# Patient Record
Sex: Male | Born: 1965
Health system: Southern US, Community
[De-identification: ages and names within clinical notes are randomized; demographics above are authoritative.]

## PROBLEM LIST (undated history)

## (undated) DIAGNOSIS — I10 Essential (primary) hypertension: Secondary | ICD-10-CM

## (undated) DIAGNOSIS — K5090 Crohn's disease, unspecified, without complications: Secondary | ICD-10-CM

## (undated) HISTORY — DX: Essential (primary) hypertension: I10

## (undated) HISTORY — PX: TONSILLECTOMY: SUR1361

## (undated) HISTORY — PX: SHOULDER SURGERY: SHX246

---

## 2001-04-08 ENCOUNTER — Emergency Department (HOSPITAL_COMMUNITY): Admission: EM | Admit: 2001-04-08 | Discharge: 2001-04-08 | Payer: Self-pay | Admitting: Emergency Medicine

## 2001-04-11 ENCOUNTER — Encounter (HOSPITAL_COMMUNITY): Admission: RE | Admit: 2001-04-11 | Discharge: 2001-07-10 | Payer: Self-pay | Admitting: Emergency Medicine

## 2013-11-14 ENCOUNTER — Encounter (HOSPITAL_COMMUNITY): Payer: Self-pay | Admitting: Emergency Medicine

## 2013-11-14 ENCOUNTER — Emergency Department (HOSPITAL_COMMUNITY)
Admission: EM | Admit: 2013-11-14 | Discharge: 2013-11-14 | Disposition: A | Discharge status: Home / Self Care | Payer: BC Managed Care – PPO | Attending: Emergency Medicine | Admitting: Emergency Medicine

## 2013-11-14 ENCOUNTER — Emergency Department (HOSPITAL_COMMUNITY): Payer: BC Managed Care – PPO

## 2013-11-14 DIAGNOSIS — R109 Unspecified abdominal pain: Secondary | ICD-10-CM

## 2013-11-14 DIAGNOSIS — R1031 Right lower quadrant pain: Secondary | ICD-10-CM | POA: Insufficient documentation

## 2013-11-14 LAB — CBC WITH DIFFERENTIAL/PLATELET
Basophils Absolute: 0 10*3/uL (ref 0.0–0.1)
Basophils Relative: 0 % (ref 0–1)
Eosinophils Absolute: 0.1 10*3/uL (ref 0.0–0.7)
Eosinophils Relative: 1 % (ref 0–5)
HCT: 41.3 % (ref 39.0–52.0)
Hemoglobin: 15 g/dL (ref 13.0–17.0)
Lymphocytes Relative: 7 % — ABNORMAL LOW (ref 12–46)
Lymphs Abs: 1.2 10*3/uL (ref 0.7–4.0)
MCH: 31 pg (ref 26.0–34.0)
MCHC: 36.3 g/dL — ABNORMAL HIGH (ref 30.0–36.0)
MCV: 85.3 fL (ref 78.0–100.0)
Monocytes Absolute: 1 10*3/uL (ref 0.1–1.0)
Monocytes Relative: 6 % (ref 3–12)
Neutro Abs: 14.7 10*3/uL — ABNORMAL HIGH (ref 1.7–7.7)
Neutrophils Relative %: 86 % — ABNORMAL HIGH (ref 43–77)
Platelets: 199 10*3/uL (ref 150–400)
RBC: 4.84 MIL/uL (ref 4.22–5.81)
RDW: 12.8 % (ref 11.5–15.5)
WBC: 17.1 10*3/uL — ABNORMAL HIGH (ref 4.0–10.5)

## 2013-11-14 LAB — COMPREHENSIVE METABOLIC PANEL
ALT: 19 U/L (ref 0–53)
AST: 18 U/L (ref 0–37)
Albumin: 3.7 g/dL (ref 3.5–5.2)
Alkaline Phosphatase: 74 U/L (ref 39–117)
BUN: 11 mg/dL (ref 6–23)
CO2: 25 mEq/L (ref 19–32)
Calcium: 9 mg/dL (ref 8.4–10.5)
Chloride: 103 mEq/L (ref 96–112)
Creatinine, Ser: 0.99 mg/dL (ref 0.50–1.35)
GFR calc Af Amer: >90 mL/min (ref >90)
GFR calc non Af Amer: >90 mL/min (ref >90)
Glucose, Bld: 98 mg/dL (ref 70–99)
Potassium: 4.1 mEq/L (ref 3.7–5.3)
Sodium: 138 mEq/L (ref 137–147)
Total Bilirubin: 1 mg/dL (ref 0.3–1.2)
Total Protein: 6.7 g/dL (ref 6.0–8.3)

## 2013-11-14 LAB — URINALYSIS, ROUTINE W REFLEX MICROSCOPIC
Bilirubin Urine: NEGATIVE
Glucose, UA: NEGATIVE mg/dL
Hgb urine dipstick: NEGATIVE
Ketones, ur: NEGATIVE mg/dL
Leukocytes, UA: NEGATIVE
Nitrite: NEGATIVE
Protein, ur: NEGATIVE mg/dL
Specific Gravity, Urine: 1.024 (ref 1.005–1.030)
Urobilinogen, UA: 1 mg/dL (ref 0.0–1.0)
pH: 5.5 (ref 5.0–8.0)

## 2013-11-14 MED ORDER — ONDANSETRON HCL 4 MG/2ML IJ SOLN
4.0000 mg | Freq: Once | INTRAMUSCULAR | Status: AC
  Administered 2013-11-14: 4 mg via INTRAVENOUS
  Filled 2013-11-14: qty 2

## 2013-11-14 MED ORDER — CIPROFLOXACIN HCL 500 MG PO TABS
500.0000 mg | ORAL_TABLET | Freq: Once | ORAL | Status: AC
  Administered 2013-11-14: 500 mg via ORAL
  Filled 2013-11-14: qty 1

## 2013-11-14 MED ORDER — SODIUM CHLORIDE 0.9 % IV BOLUS (SEPSIS)
1000.0000 mL | Freq: Once | INTRAVENOUS | Status: AC
  Administered 2013-11-14: 1000 mL via INTRAVENOUS

## 2013-11-14 MED ORDER — METRONIDAZOLE 500 MG PO TABS
500.0000 mg | ORAL_TABLET | Freq: Once | ORAL | Status: AC
  Administered 2013-11-14: 500 mg via ORAL
  Filled 2013-11-14: qty 1

## 2013-11-14 MED ORDER — OXYCODONE-ACETAMINOPHEN 5-325 MG PO TABS: 1.0000 | ORAL_TABLET | Freq: Four times a day (QID) | ORAL | Status: DC | PRN

## 2013-11-14 MED ORDER — ONDANSETRON HCL 4 MG PO TABS: 4.0000 mg | ORAL_TABLET | Freq: Four times a day (QID) | ORAL | Status: DC

## 2013-11-14 MED ORDER — CIPROFLOXACIN HCL 500 MG PO TABS: 500.0000 mg | ORAL_TABLET | Freq: Two times a day (BID) | ORAL | Status: DC

## 2013-11-14 MED ORDER — HYDROMORPHONE HCL PF 1 MG/ML IJ SOLN
1.0000 mg | Freq: Once | INTRAMUSCULAR | Status: AC
  Administered 2013-11-14: 1 mg via INTRAVENOUS
  Filled 2013-11-14: qty 1

## 2013-11-14 MED ORDER — IOHEXOL 300 MG/ML  SOLN
100.0000 mL | Freq: Once | INTRAMUSCULAR | Status: AC | PRN
  Administered 2013-11-14: 100 mL via INTRAVENOUS

## 2013-11-14 MED ORDER — IOHEXOL 300 MG/ML  SOLN
50.0000 mL | Freq: Once | INTRAMUSCULAR | Status: AC | PRN
  Administered 2013-11-14: 50 mL via ORAL

## 2013-11-14 MED ORDER — METRONIDAZOLE 500 MG PO TABS: 500.0000 mg | ORAL_TABLET | Freq: Two times a day (BID) | ORAL | Status: DC

## 2013-11-14 NOTE — ED Notes (Signed)
Pt from home. Complains of right lower abd pain since noon today. Pain progressively worsening. Denies N/V.

## 2013-11-14 NOTE — ED Provider Notes (Signed)
Medical screening examination/treatment/procedure(s) were performed by non-physician practitioner and as supervising physician I was immediately available for consultation/collaboration.   Celene KrasJon R Kenny Stern, MD 11/14/13 2032

## 2013-11-14 NOTE — ED Provider Notes (Signed)
CSN: [id number]     Arrival date & time 11/14/13    History   First MD Initiated Contact with Patient 11/14/13 1721     Chief Complaint  Patient presents with  . Abdominal Pain     (Consider location/radiation/quality/duration/timing/severity/associated sxs/prior Treatment) HPI Patient to the ER with complaints of abdominal pains that started this afternoon. He said he starts around his umbilicus and then as the day progressed moved towards his RLQ. Is has also been getting more severe in quality. He has no hx of abdominal surgery. He does have GI problems at baseline but denies any changes in his bowel moments. Last bowel movement was this morning and it was normal. He tried taking Benefiber in case his pain was gas or constipation but this did not help. He last ate at noon today. He has not had nausea or vomiting. He called the on-call physician at [facility] who advised he come to the hospital for a abdominal scan. Pain currently 6/10.  History reviewed. No pertinent past medical history. History reviewed. No pertinent past surgical history. History reviewed. No pertinent family history. History  Substance Use Topics  . Smoking status: Never Smoker   . Smokeless tobacco: Not on file  . Alcohol Use: No    Review of Systems  The patient denies anorexia, fever, weight loss,, vision loss, decreased hearing, hoarseness, chest pain, syncope, dyspnea on exertion, peripheral edema, balance deficits, hemoptysis, melena, hematochezia, severe indigestion/heartburn, hematuria, incontinence, genital sores, muscle weakness, suspicious skin lesions, transient blindness, difficulty walking, depression, unusual weight change, abnormal bleeding, enlarged lymph nodes, angioedema, and breast masses.   Allergies  Review of patient's allergies indicates no known allergies.  Home Medications   Current Outpatient Rx  Name  Route  Sig  Dispense  Refill  . albuterol (PROVENTIL HFA;VENTOLIN  HFA) 108 (90 BASE) MCG/ACT inhaler   Inhalation   Inhale 2 puffs into the lungs every 6 (six) hours as needed for wheezing or shortness of breath.         . escitalopram (LEXAPRO) 20 MG tablet   Oral   Take 20 mg by mouth every morning.          BP 149/92  Pulse 70  Temp(Src) 98.5 F (36.9 C) (Oral)  Ht 5\' 9"  (1.753 m)  Wt 170 lb (77.111 kg)  BMI 25.09 kg/m2  SpO2 99% Physical Exam  Nursing note and vitals reviewed. Constitutional: He appears well-developed and well-nourished. No distress.  HENT:  Head: Normocephalic and atraumatic.  Eyes: Pupils are equal, round, and reactive to light.  Neck: Normal range of motion. Neck supple.  Cardiovascular: Normal rate and regular rhythm.   Pulmonary/Chest: Effort normal.  Abdominal: Soft. He exhibits no distension. Bowel sounds are increased. There is tenderness in the right lower quadrant. There is guarding. There is no rigidity and no CVA tenderness.  Neurological: He is alert.  Skin: Skin is warm and dry.    ED Course  Procedures (including critical care time) Labs Review Labs Reviewed  CBC WITH DIFFERENTIAL - Abnormal; Notable for the following:    WBC 17.1 (*)    MCHC 36.3 (*)    Neutrophils Relative % 86 (*)    Neutro Abs 14.7 (*)    Lymphocytes Relative 7 (*)    All other components within normal limits  COMPREHENSIVE METABOLIC PANEL  URINALYSIS, ROUTINE W REFLEX MICROSCOPIC   Imaging Review Ct Abdomen Pelvis W Contrast  11/14/2013   CLINICAL DATA:  Right  upper quadrant pain.  Nausea vomiting.  EXAM: CT ABDOMEN AND PELVIS WITH CONTRAST  TECHNIQUE: Multidetector CT imaging of the abdomen and pelvis was performed using the standard protocol following bolus administration of intravenous contrast.  CONTRAST:  50mL OMNIPAQUE IOHEXOL 300 MG/ML SOLN, 100mL OMNIPAQUE IOHEXOL 300 MG/ML SOLN  COMPARISON:  None.  FINDINGS: There is wall thickening of the distal ileum, extending for approximately 20 cm from the ileocolic junction.  Wall measures 6 7 mm in maximum thickness. There is associated mesenteric stranding and a small amount of free fluid. Small bowel above this is mildly distended. There is small bowel air-fluid levels.  No colonic wall thickening is seen. There are several left colon diverticula. No diverticulitis. Normal appendix is visualized. Stomach is unremarkable.  Lung bases are essentially clear. The heart is normal in size. Normal liver, spleen, gallbladder and pancreas. No bile duct dilation. No adrenal masses. Normal kidneys, ureters and bladder. There are bilateral fat containing inguinal hernias. Sigmoid colon extends into the internal inguinal ring on the left.  There are prominent mesenteric lymph nodes most evident along the ileo palate chain. The largest measures 1 cm in short axis.  No significant bony abnormality.  IMPRESSION: 1. Wall thickening and adjacent inflammation of the distal ileum for an approximate 20 cm length of small bowel. This may reflect an inflammatory or infectious enteritis. There are associated prominent to borderline enlarged palate mesenteric lymph nodes. Trace ascites. Crohn's disease should be considered. 2. No other acute findings.  Normal appendix is visualized.   Electronically Signed   By: Amie Portlandavid  Ormond M.D.   On: 11/14/2013 19:10     EKG Interpretation None      MDM   Final diagnoses:  Abdominal pain    CT scan says Crohn's vs inflammatory vs infectious enteritis. I discussed with Dr. Lynelle DoctorKnapp, J. Who agreed that I called the GI doctor to discuss.   Dr. Ewing SchleinMagod recommends treating patient with antibiotics, Cipro and Flagyl as well as give pain medication for home. Pt needs to be on liquid diet. If patient continues to be in severe pain, can admit if you need otherwise he should be okay to go home. Patient and wife are relieved by the news and would prefer to go home than stay. Dr Ewing SchleinMagod asks they call in the morning so they can be seen early this week for follow-up and  further evaluation.  Re-evaluation of abdomen, it is soft and pain much improved. Declines more pain medication before dc.  48 y.o.Christoper FabianMichael Garton's evaluation in the Emergency Department is complete. It has been determined that no acute conditions requiring further emergency intervention are present at this time. The patient/guardian have been advised of the diagnosis and plan. We have discussed signs and symptoms that warrant return to the ED, such as changes or worsening in symptoms.  Vital signs are stable at discharge. Filed Vitals:   11/14/13 1728  BP: 149/92  Pulse: 70  Temp: 98.5 F (36.9 C)    Patient/guardian has voiced understanding and agreed to follow-up with the PCP or specialist.     Dorthula Matasiffany G Neta Upadhyay, PA-C 11/14/13 2029  Dorthula Matasiffany G Harlee Eckroth, PA-C 11/14/13 2031

## 2013-11-14 NOTE — ED Notes (Signed)
Provided pt with urinal. Pt wife at bedside assisting pt

## 2013-11-14 NOTE — Discharge Instructions (Signed)
Diet The clear liquid diet consists of foods that are liquid or will become liquid at room temperature. Examples of foods allowed on a clear liquid diet include fruit juice, broth or bouillon, gelatin, or frozen ice pops. You should be able to see through the liquid. The purpose of this diet is to provide the necessary fluids, electrolytes (such as sodium and potassium), and energy to keep the body functioning during times when you are not able to consume a regular diet. A clear liquid diet should not be continued for long periods of time, as it is not nutritionally adequate.  A CLEAR LIQUID DIET MAY BE NEEDED:  When a sudden-onset (acute) condition occurs before or after surgery.   As the first step in oral feeding.   For fluid and electrolyte replacement in diarrheal diseases.   As a diet before certain medical tests are performed.  ADEQUACY The clear liquid diet is adequate only in ascorbic acid, according to the Recommended Dietary Allowances of the National Research Council.  CHOOSING FOODS Breads and Starches  Allowed: None are allowed.   Avoid: All are to be avoided.  Vegetables  Allowed: Strained vegetable juices.   Avoid: Any others.  Fruit  Allowed: Strained fruit juices and fruit drinks. Include 1 serving of citrus or vitamin C-enriched fruit juice daily.   Avoid: Any others.  Meat and Meat Substitutes  Allowed: None are allowed.   Avoid: All are to be avoided.  Milk Products  Allowed: None are allowed.   Avoid: All are to be avoided.  Soups and Combination Foods  Allowed: Clear bouillon, broth, or strained broth-based soups.   Avoid: Any others.  Desserts and Sweets  Allowed: Sugar, honey. High-protein gelatin. Flavored gelatin, ices, or frozen ice pops that do not contain milk.   Avoid: Any others.  Fats and Oils  Allowed: None are allowed.   Avoid: All are to be avoided.  Beverages  Allowed: Cereal  beverages, coffee (regular or decaffeinated), tea, or soda at the discretion of your health care provider.   Avoid: Any others.  Condiments  Allowed: Salt.   Avoid: Any others, including pepper.  Supplements  Allowed: Liquid nutrition beverages that you can see through.   Avoid: Any others that contain lactose or fiber. SAMPLE MEAL PLAN Breakfast  4 oz (120 mL) strained orange juice.   to 1 cup (120 to 240 mL) gelatin (plain or fortified).  1 cup (240 mL) beverage (coffee or tea).  Sugar, if desired. Midmorning Snack   cup (120 mL) gelatin (plain or fortified). Lunch  1 cup (240 mL) broth or consomm.  4 oz (120 mL) strained grapefruit juice.   cup (120 mL) gelatin (plain or fortified).  1 cup (240 mL) beverage (coffee or tea).  Sugar, if desired. Midafternoon Snack   cup (120 mL) fruit ice.   cup (120 mL) strained fruit juice. Dinner  1 cup (240 mL) broth or consomm.   cup (120 mL) cranberry juice.   cup (120 mL) flavored gelatin (plain or fortified).  1 cup (240 mL) beverage (coffee or tea).  Sugar, if desired. Evening Snack  4 oz (120 mL) strained apple juice (vitamin C-fortified).   cup (120 mL) flavored gelatin (plain or fortified). MAKE SURE YOU:  Understand these instructions.  Will watch your child's condition.  Will get help right away if your child is not doing well or gets worse. Document Released: 08/06/2005 Document Revised: 04/08/2013 Document Reviewed: 01/06/2013 Exit[employer] Patient Information 2014 [employer]. Abdominal   Pain, Adult Many things can cause abdominal pain. Usually, abdominal pain is not caused by a disease and will improve without treatment. It can often be observed and treated at home. Your health care provider will do a physical exam and possibly order blood tests and X-rays to help determine the seriousness of your pain. However, in many cases, more time must pass before a clear cause of the pain  can be found. Before that point, your health care provider may not know if you need more testing or further treatment. HOME CARE INSTRUCTIONS  Monitor your abdominal pain for any changes. The following actions may help to alleviate any discomfort you are experiencing:  Only take over-the-counter or prescription medicines as directed by your health care provider.  Do not take laxatives unless directed to do so by your health care provider.  Try a clear liquid diet (broth, tea, or water) as directed by your health care provider. Slowly move to a bland diet as tolerated. SEEK MEDICAL CARE IF:  You have unexplained abdominal pain.  You have abdominal pain associated with nausea or diarrhea.  You have pain when you urinate or have a bowel movement.  You experience abdominal pain that wakes you in the night.  You have abdominal pain that is worsened or improved by eating food.  You have abdominal pain that is worsened with eating fatty foods. SEEK IMMEDIATE MEDICAL CARE IF:   Your pain does not go away within 2 hours.  You have a fever.  You keep throwing up (vomiting).  Your pain is felt only in portions of the abdomen, such as the right side or the left lower portion of the abdomen.  You pass bloody or black tarry stools. MAKE SURE YOU:  Understand these instructions.   Will watch your condition.   Will get help right away if you are not doing well or get worse.  Document Released: 05/16/2005 Document Revised: 05/27/2013 Document Reviewed: 04/15/2013 [employer] Patient Information 2014 [employer].  

## 2016-01-03 DIAGNOSIS — L820 Inflamed seborrheic keratosis: Secondary | ICD-10-CM | POA: Diagnosis not present

## 2016-01-03 DIAGNOSIS — D225 Melanocytic nevi of trunk: Secondary | ICD-10-CM | POA: Diagnosis not present

## 2016-01-03 DIAGNOSIS — L570 Actinic keratosis: Secondary | ICD-10-CM | POA: Diagnosis not present

## 2016-02-13 DIAGNOSIS — F411 Generalized anxiety disorder: Secondary | ICD-10-CM | POA: Diagnosis not present

## 2016-02-13 DIAGNOSIS — E782 Mixed hyperlipidemia: Secondary | ICD-10-CM | POA: Diagnosis not present

## 2016-02-13 DIAGNOSIS — Z125 Encounter for screening for malignant neoplasm of prostate: Secondary | ICD-10-CM | POA: Diagnosis not present

## 2016-02-13 DIAGNOSIS — Z0000 Encounter for general adult medical examination without abnormal findings: Secondary | ICD-10-CM | POA: Diagnosis not present

## 2016-02-13 DIAGNOSIS — K5010 Crohn's disease of large intestine without complications: Secondary | ICD-10-CM | POA: Diagnosis not present

## 2016-02-13 DIAGNOSIS — J45990 Exercise induced bronchospasm: Secondary | ICD-10-CM | POA: Diagnosis not present

## 2016-06-19 DIAGNOSIS — K5010 Crohn's disease of large intestine without complications: Secondary | ICD-10-CM | POA: Diagnosis not present

## 2016-07-03 DIAGNOSIS — L570 Actinic keratosis: Secondary | ICD-10-CM | POA: Diagnosis not present

## 2017-02-18 DIAGNOSIS — F411 Generalized anxiety disorder: Secondary | ICD-10-CM | POA: Diagnosis not present

## 2017-02-18 DIAGNOSIS — I10 Essential (primary) hypertension: Secondary | ICD-10-CM | POA: Diagnosis not present

## 2017-02-18 DIAGNOSIS — Z125 Encounter for screening for malignant neoplasm of prostate: Secondary | ICD-10-CM | POA: Diagnosis not present

## 2017-02-18 DIAGNOSIS — J45990 Exercise induced bronchospasm: Secondary | ICD-10-CM | POA: Diagnosis not present

## 2017-02-18 DIAGNOSIS — Z0001 Encounter for general adult medical examination with abnormal findings: Secondary | ICD-10-CM | POA: Diagnosis not present

## 2017-03-18 DIAGNOSIS — I10 Essential (primary) hypertension: Secondary | ICD-10-CM | POA: Diagnosis not present

## 2017-03-18 DIAGNOSIS — Z79899 Other long term (current) drug therapy: Secondary | ICD-10-CM | POA: Diagnosis not present

## 2017-05-01 DIAGNOSIS — H66002 Acute suppurative otitis media without spontaneous rupture of ear drum, left ear: Secondary | ICD-10-CM | POA: Diagnosis not present

## 2017-06-07 DIAGNOSIS — Z23 Encounter for immunization: Secondary | ICD-10-CM | POA: Diagnosis not present

## 2017-06-07 DIAGNOSIS — W458XXA Other foreign body or object entering through skin, initial encounter: Secondary | ICD-10-CM | POA: Diagnosis not present

## 2017-06-07 DIAGNOSIS — Y9389 Activity, other specified: Secondary | ICD-10-CM | POA: Diagnosis not present

## 2017-06-07 DIAGNOSIS — I10 Essential (primary) hypertension: Secondary | ICD-10-CM | POA: Diagnosis not present

## 2017-06-07 DIAGNOSIS — S61442A Puncture wound with foreign body of left hand, initial encounter: Secondary | ICD-10-CM | POA: Diagnosis not present

## 2017-09-18 DIAGNOSIS — F411 Generalized anxiety disorder: Secondary | ICD-10-CM | POA: Diagnosis not present

## 2017-09-18 DIAGNOSIS — I10 Essential (primary) hypertension: Secondary | ICD-10-CM | POA: Diagnosis not present

## 2017-10-10 DIAGNOSIS — I10 Essential (primary) hypertension: Secondary | ICD-10-CM | POA: Diagnosis not present

## 2017-10-10 DIAGNOSIS — F411 Generalized anxiety disorder: Secondary | ICD-10-CM | POA: Diagnosis not present

## 2017-10-10 DIAGNOSIS — Z79899 Other long term (current) drug therapy: Secondary | ICD-10-CM | POA: Diagnosis not present

## 2017-12-24 DIAGNOSIS — K5010 Crohn's disease of large intestine without complications: Secondary | ICD-10-CM | POA: Diagnosis not present

## 2018-03-13 DIAGNOSIS — L570 Actinic keratosis: Secondary | ICD-10-CM | POA: Diagnosis not present

## 2018-03-13 DIAGNOSIS — L814 Other melanin hyperpigmentation: Secondary | ICD-10-CM | POA: Diagnosis not present

## 2018-03-13 DIAGNOSIS — B078 Other viral warts: Secondary | ICD-10-CM | POA: Diagnosis not present

## 2018-03-13 DIAGNOSIS — D225 Melanocytic nevi of trunk: Secondary | ICD-10-CM | POA: Diagnosis not present

## 2018-03-13 DIAGNOSIS — L821 Other seborrheic keratosis: Secondary | ICD-10-CM | POA: Diagnosis not present

## 2018-04-18 DIAGNOSIS — Z0000 Encounter for general adult medical examination without abnormal findings: Secondary | ICD-10-CM | POA: Diagnosis not present

## 2018-04-18 DIAGNOSIS — Z125 Encounter for screening for malignant neoplasm of prostate: Secondary | ICD-10-CM | POA: Diagnosis not present

## 2018-04-18 DIAGNOSIS — I10 Essential (primary) hypertension: Secondary | ICD-10-CM | POA: Diagnosis not present

## 2018-04-18 DIAGNOSIS — Z13220 Encounter for screening for lipoid disorders: Secondary | ICD-10-CM | POA: Diagnosis not present

## 2018-04-18 DIAGNOSIS — J45990 Exercise induced bronchospasm: Secondary | ICD-10-CM | POA: Diagnosis not present

## 2018-12-17 DIAGNOSIS — K5080 Crohn's disease of both small and large intestine without complications: Secondary | ICD-10-CM | POA: Diagnosis not present

## 2018-12-22 DIAGNOSIS — L237 Allergic contact dermatitis due to plants, except food: Secondary | ICD-10-CM | POA: Diagnosis not present

## 2019-01-31 DIAGNOSIS — Z20828 Contact with and (suspected) exposure to other viral communicable diseases: Secondary | ICD-10-CM | POA: Diagnosis not present

## 2019-02-11 DIAGNOSIS — I10 Essential (primary) hypertension: Secondary | ICD-10-CM | POA: Diagnosis not present

## 2019-02-11 DIAGNOSIS — K5080 Crohn's disease of both small and large intestine without complications: Secondary | ICD-10-CM | POA: Diagnosis not present

## 2019-05-01 DIAGNOSIS — K640 First degree hemorrhoids: Secondary | ICD-10-CM | POA: Diagnosis not present

## 2019-05-01 DIAGNOSIS — K6389 Other specified diseases of intestine: Secondary | ICD-10-CM | POA: Diagnosis not present

## 2019-05-01 DIAGNOSIS — K633 Ulcer of intestine: Secondary | ICD-10-CM | POA: Diagnosis not present

## 2019-05-01 DIAGNOSIS — K5080 Crohn's disease of both small and large intestine without complications: Secondary | ICD-10-CM | POA: Diagnosis not present

## 2019-05-01 DIAGNOSIS — K5289 Other specified noninfective gastroenteritis and colitis: Secondary | ICD-10-CM | POA: Diagnosis not present

## 2019-09-15 ENCOUNTER — Inpatient Hospital Stay (HOSPITAL_COMMUNITY)
Admission: EM | Admit: 2019-09-15 | Discharge: 2019-09-17 | DRG: 387 | Disposition: A | Discharge status: Home / Self Care | Payer: BC Managed Care – PPO | Attending: Internal Medicine | Admitting: Internal Medicine

## 2019-09-15 ENCOUNTER — Encounter (HOSPITAL_COMMUNITY): Payer: Self-pay | Admitting: *Deleted

## 2019-09-15 ENCOUNTER — Emergency Department (HOSPITAL_COMMUNITY): Payer: BC Managed Care – PPO

## 2019-09-15 ENCOUNTER — Other Ambulatory Visit: Payer: Self-pay

## 2019-09-15 DIAGNOSIS — K50012 Crohn's disease of small intestine with intestinal obstruction: Principal | ICD-10-CM | POA: Diagnosis present

## 2019-09-15 DIAGNOSIS — Z20822 Contact with and (suspected) exposure to covid-19: Secondary | ICD-10-CM | POA: Diagnosis not present

## 2019-09-15 DIAGNOSIS — Z03818 Encounter for observation for suspected exposure to other biological agents ruled out: Secondary | ICD-10-CM | POA: Diagnosis not present

## 2019-09-15 DIAGNOSIS — T508X5A Adverse effect of diagnostic agents, initial encounter: Secondary | ICD-10-CM | POA: Diagnosis present

## 2019-09-15 DIAGNOSIS — I10 Essential (primary) hypertension: Secondary | ICD-10-CM | POA: Diagnosis present

## 2019-09-15 DIAGNOSIS — R933 Abnormal findings on diagnostic imaging of other parts of digestive tract: Secondary | ICD-10-CM | POA: Diagnosis not present

## 2019-09-15 DIAGNOSIS — R109 Unspecified abdominal pain: Secondary | ICD-10-CM | POA: Diagnosis not present

## 2019-09-15 DIAGNOSIS — R1084 Generalized abdominal pain: Secondary | ICD-10-CM | POA: Diagnosis not present

## 2019-09-15 DIAGNOSIS — K56600 Partial intestinal obstruction, unspecified as to cause: Secondary | ICD-10-CM

## 2019-09-15 DIAGNOSIS — K56609 Unspecified intestinal obstruction, unspecified as to partial versus complete obstruction: Secondary | ICD-10-CM

## 2019-09-15 HISTORY — DX: Crohn's disease, unspecified, without complications: K50.90

## 2019-09-15 LAB — CBC
HCT: 47.5 % (ref 39.0–52.0)
Hemoglobin: 16.1 g/dL (ref 13.0–17.0)
MCH: 30.7 pg (ref 26.0–34.0)
MCHC: 33.9 g/dL (ref 30.0–36.0)
MCV: 90.5 fL (ref 80.0–100.0)
Platelets: 315 10*3/uL (ref 150–400)
RBC: 5.25 MIL/uL (ref 4.22–5.81)
RDW: 12.7 % (ref 11.5–15.5)
WBC: 18.1 10*3/uL — ABNORMAL HIGH (ref 4.0–10.5)
nRBC: 0 % (ref 0.0–0.2)

## 2019-09-15 LAB — COMPREHENSIVE METABOLIC PANEL
ALT: 22 U/L (ref 0–44)
AST: 23 U/L (ref 15–41)
Albumin: 4.4 g/dL (ref 3.5–5.0)
Alkaline Phosphatase: 66 U/L (ref 38–126)
Anion gap: 10 (ref 5–15)
BUN: 16 mg/dL (ref 6–20)
CO2: 24 mmol/L (ref 22–32)
Calcium: 9.8 mg/dL (ref 8.9–10.3)
Chloride: 104 mmol/L (ref 98–111)
Creatinine, Ser: 1.07 mg/dL (ref 0.61–1.24)
GFR calc Af Amer: >60 mL/min (ref >60)
GFR calc non Af Amer: >60 mL/min (ref >60)
Glucose, Bld: 113 mg/dL — ABNORMAL HIGH (ref 70–99)
Potassium: 4.3 mmol/L (ref 3.5–5.1)
Sodium: 138 mmol/L (ref 135–145)
Total Bilirubin: 1.4 mg/dL — ABNORMAL HIGH (ref 0.3–1.2)
Total Protein: 7.4 g/dL (ref 6.5–8.1)

## 2019-09-15 LAB — RESPIRATORY PANEL BY RT PCR (FLU A&B, COVID)
Influenza A by PCR: NEGATIVE
Influenza B by PCR: NEGATIVE
SARS Coronavirus 2 by RT PCR: NEGATIVE

## 2019-09-15 LAB — LACTIC ACID, PLASMA: Lactic Acid, Venous: 0.7 mmol/L (ref 0.5–1.9)

## 2019-09-15 LAB — LIPASE, BLOOD: Lipase: 25 U/L (ref 11–51)

## 2019-09-15 MED ORDER — SODIUM CHLORIDE 0.9 % IV BOLUS
1000.0000 mL | Freq: Once | INTRAVENOUS | Status: AC
  Administered 2019-09-15: 22:00:00 1000 mL via INTRAVENOUS

## 2019-09-15 MED ORDER — DIPHENHYDRAMINE HCL 50 MG/ML IJ SOLN: 50.0000 mg | Freq: Once | INTRAMUSCULAR | Status: AC

## 2019-09-15 MED ORDER — METHYLPREDNISOLONE SODIUM SUCC 125 MG IJ SOLR
125.0000 mg | Freq: Once | INTRAMUSCULAR | Status: AC
  Administered 2019-09-15: 125 mg via INTRAVENOUS
  Filled 2019-09-15: qty 2

## 2019-09-15 MED ORDER — ONDANSETRON HCL 4 MG/2ML IJ SOLN
4.0000 mg | Freq: Once | INTRAMUSCULAR | Status: AC
  Administered 2019-09-15: 22:00:00 4 mg via INTRAVENOUS
  Filled 2019-09-15: qty 2

## 2019-09-15 MED ORDER — DIPHENHYDRAMINE HCL 50 MG/ML IJ SOLN
INTRAMUSCULAR | Status: AC
  Administered 2019-09-15: 50 mg via INTRAVENOUS
  Filled 2019-09-15: qty 1

## 2019-09-15 MED ORDER — MORPHINE SULFATE (PF) 4 MG/ML IV SOLN
4.0000 mg | Freq: Once | INTRAVENOUS | Status: AC
  Administered 2019-09-15: 4 mg via INTRAVENOUS
  Filled 2019-09-15: qty 1

## 2019-09-15 MED ORDER — IOHEXOL 300 MG/ML  SOLN
100.0000 mL | Freq: Once | INTRAMUSCULAR | Status: AC | PRN
  Administered 2019-09-15: 22:00:00 100 mL via INTRAVENOUS

## 2019-09-15 MED ORDER — SODIUM CHLORIDE 0.9% FLUSH: 3.0000 mL | Freq: Once | INTRAVENOUS | Status: DC

## 2019-09-15 NOTE — ED Triage Notes (Signed)
Pt has history of Chron's disease, flare up started this morning, bloated, vomiting and abd pain.

## 2019-09-15 NOTE — ED Notes (Addendum)
Right facial swelling after receiving IV contract. Patient denies any nausea or respiratory difficulty

## 2019-09-15 NOTE — ED Provider Notes (Signed)
[facility]-EMERGENCY DEPT Provider Note   CSN: [id number] Arrival date & time: 09/15/19  1704     History Chief Complaint  Patient presents with  . Abdominal Pain    Vernon Rollins is a 54 y.o. male.  The history is provided by the patient and medical records. No language interpreter was used.  Abdominal Pain Pain location:  Generalized Pain quality: aching   Pain radiates to:  Does not radiate Pain severity:  Severe Onset quality:  Gradual Duration:  1 day Timing:  Constant Progression:  Unchanged Chronicity:  Recurrent Relieved by:  Nothing Worsened by:  Nothing Ineffective treatments:  None tried Associated symptoms: belching, nausea and vomiting   Associated symptoms: no chest pain, no chills, no cough, no diarrhea, no dysuria, no fatigue, no fever, no flatus and no shortness of breath        Past Medical History:  Diagnosis Date  . Crohn disease (HCC)     There are no problems to display for this patient.   Past Surgical History:  Procedure Laterality Date  . SHOULDER SURGERY         No family history on file.  Social History   Tobacco Use  . Smoking status: Never Smoker  . Smokeless tobacco: Never Used  Substance Use Topics  . Alcohol use: No  . Drug use: No    Home Medications Prior to Admission medications   Medication Sig Start Date End Date Taking? Authorizing Provider  albuterol (PROVENTIL HFA;VENTOLIN HFA) 108 (90 BASE) MCG/ACT inhaler Inhale 2 puffs into the lungs every 6 (six) hours as needed for wheezing or shortness of breath.   Yes [provider]  escitalopram (LEXAPRO) 20 MG tablet Take 20 mg by mouth every morning.   Yes [provider]  losartan (COZAAR) 100 MG tablet Take 100 mg by mouth daily. 07/27/19  Yes [provider]  mesalamine (APRISO) 0.375 g 24 hr capsule Take 0.75 g by mouth daily. Take 2 capsules (0.75 mg) 07/21/19  Yes [provider]  ciprofloxacin (CIPRO)  500 MG tablet Take 1 tablet (500 mg total) by mouth 2 (two) times daily. Patient not taking: Reported on 09/15/2019 11/14/13   Marlon Pel, PA-C  metroNIDAZOLE (FLAGYL) 500 MG tablet Take 1 tablet (500 mg total) by mouth 2 (two) times daily. Patient not taking: Reported on 09/15/2019 11/14/13   Marlon Pel, PA-C  ondansetron (ZOFRAN) 4 MG tablet Take 1 tablet (4 mg total) by mouth every 6 (six) hours. Patient not taking: Reported on 09/15/2019 11/14/13   Marlon Pel, PA-C  oxyCODONE-acetaminophen (PERCOCET/ROXICET) 5-325 MG per tablet Take 1-2 tablets by mouth every 6 (six) hours as needed for severe pain. Patient not taking: Reported on 09/15/2019 11/14/13   Marlon Pel, PA-C    Allergies    Patient has no known allergies.  Review of Systems   Review of Systems  Constitutional: Negative for chills, diaphoresis, fatigue and fever.  HENT: Negative for congestion.   Eyes: Negative for visual disturbance.  Respiratory: Negative for cough, chest tightness and shortness of breath.   Cardiovascular: Negative for chest pain and palpitations.  Gastrointestinal: Positive for abdominal distention, abdominal pain, nausea and vomiting. Negative for diarrhea and flatus.  Genitourinary: Negative for dysuria, flank pain and frequency.  Musculoskeletal: Negative for back pain and neck pain.  Neurological: Negative for light-headedness and headaches.  Psychiatric/Behavioral: Negative for confusion.  All other systems reviewed and are negative.   Physical Exam Updated Vital Signs BP (!) 150/101 (  BP Location: Right Arm)   Pulse 75   Temp 99.5 F (37.5 C) (Oral)   Resp 17   Ht 5\' 8"  (1.727 m)   Wt 81.6 kg   SpO2 98%   BMI 27.37 kg/m   Physical Exam Vitals and nursing note reviewed.  Constitutional:      General: He is not in acute distress.    Appearance: He is well-developed. He is not ill-appearing, toxic-appearing or diaphoretic.  HENT:     Head: Normocephalic and atraumatic.      Mouth/Throat:     Mouth: Mucous membranes are moist.  Eyes:     General: No scleral icterus.    Conjunctiva/sclera: Conjunctivae normal.  Cardiovascular:     Rate and Rhythm: Normal rate and regular rhythm.     Heart sounds: No murmur.  Pulmonary:     Effort: Pulmonary effort is normal. No respiratory distress.     Breath sounds: Normal breath sounds.  Abdominal:     General: Bowel sounds are decreased. There is distension.     Palpations: Abdomen is soft.     Tenderness: There is generalized abdominal tenderness. There is no right CVA tenderness, left CVA tenderness, guarding or rebound.    Musculoskeletal:     Cervical back: Neck supple.  Skin:    General: Skin is warm and dry.     Capillary Refill: Capillary refill takes less than 2 seconds.     Coloration: Skin is not jaundiced or pale.     Findings: No rash.  Neurological:     General: No focal deficit present.     Mental Status: He is alert.  Psychiatric:        Mood and Affect: Mood normal.     ED Results / Procedures / Treatments   Labs (all labs ordered are listed, but only abnormal results are displayed) Labs Reviewed  COMPREHENSIVE METABOLIC PANEL - Abnormal; Notable for the following components:      Result Value   Glucose, Bld 113 (*)    Total Bilirubin 1.4 (*)    All other components within normal limits  CBC - Abnormal; Notable for the following components:   WBC 18.1 (*)    All other components within normal limits  RESPIRATORY PANEL BY RT PCR (FLU A&B, COVID)  URINE CULTURE  LIPASE, BLOOD  LACTIC ACID, PLASMA  URINALYSIS, ROUTINE W REFLEX MICROSCOPIC    EKG None  Radiology CT ABDOMEN PELVIS W CONTRAST  Result Date: 09/15/2019 CLINICAL DATA:  Abdominal pain with history of Crohn's disease. EXAM: CT ABDOMEN AND PELVIS WITH CONTRAST TECHNIQUE: Multidetector CT imaging of the abdomen and pelvis was performed using the standard protocol following bolus administration of intravenous contrast.  CONTRAST:  09/17/2019 OMNIPAQUE IOHEXOL 300 MG/ML  SOLN COMPARISON:  November 14, 2013 FINDINGS: Lower chest: No acute abnormality. Hepatobiliary: No focal liver abnormality is seen. No gallstones, gallbladder wall thickening, or biliary dilatation. Pancreas: Unremarkable. No pancreatic ductal dilatation or surrounding inflammatory changes. Spleen: Normal in size without focal abnormality. Adrenals/Urinary Tract: Adrenal glands are unremarkable. Kidneys are normal, without renal calculi, focal lesion, or hydronephrosis. Bladder is unremarkable. Stomach/Bowel: Stomach is within normal limits. The appendix is not clearly identified. Multiple dilated small bowel loops are seen within the mid and lower abdomen (maximum small bowel diameter of approximately 3.2 cm). An abrupt transition zone is seen within the anterior aspect of the right lower quadrant (axial CT images 48 through 53, CT series number 3). Mild thickening of the terminal ileum  is noted. Vascular/Lymphatic: Mild aortic atherosclerosis. No enlarged abdominal or pelvic lymph nodes. Reproductive: Prostate is unremarkable. Other: A 3.9 cm x 2.6 cm right inguinal hernia is seen. An additional 3.3 cm x 2.5 cm left inguinal hernia is noted. Both hernias contain fat and fluid. A small amount of posterior pelvic free fluid is seen. Musculoskeletal: No acute or significant osseous findings. IMPRESSION: 1. Findings consistent with a distal partial small bowel obstruction. 2. Mild thickening of the terminal ileum. Mild inflammation involving this portion of distal ileum cannot be excluded. Electronically Signed   By: Aram Candela M.D.   On: 09/15/2019 22:37    Procedures Procedures (including critical care time)  Medications Ordered in ED Medications  sodium chloride flush (NS) 0.9 % injection 3 mL (0 mLs Intravenous Hold 09/15/19 2146)  morphine 4 MG/ML injection 4 mg (4 mg Intravenous Given 09/15/19 2136)  ondansetron (ZOFRAN) injection 4 mg (4 mg Intravenous  Given 09/15/19 2136)  sodium chloride 0.9 % bolus 1,000 mL (0 mLs Intravenous Stopped 09/15/19 2230)  iohexol (OMNIPAQUE) 300 MG/ML solution 100 mL (100 mLs Intravenous Contrast Given 09/15/19 2214)  diphenhydrAMINE (BENADRYL) injection 50 mg (50 mg Intravenous Given 09/15/19 2313)  methylPREDNISolone sodium succinate (SOLU-MEDROL) 125 mg/2 mL injection 125 mg (125 mg Intravenous Given 09/15/19 2336)    ED Course  I have reviewed the triage vital signs and the nursing notes.  Pertinent labs & imaging results that were available during my care of the patient were reviewed by me and considered in my medical decision making (see chart for details).    MDM Rules/Calculators/A&P                      Natnael Biederman is a 54 y.o. male with past medical history significant for Crohn's disease who presents with nausea, vomiting, abdominal pain, decreased flatus, and abdominal distention.  Patient reports that he is been feeling well the last few days but today started having abdominal pain.  Reports it feels similar to her prior Crohn's flares but feels worse.  He reports the pain is diffuse and severe.  He has had nausea and vomiting all day and has not kept any food, fluids, or medications down.  He is not passing any gas or having bowel movements.  He denies trauma.  He reports the pain is constant but waxes and wanes.  He feels his abdomen is distended and bloated.  He denies shortness of breath, chest pain, or cough.  No recent Covid symptoms.  No urinary symptoms.  No other complaints.  No history of small bowel obstruction.  On exam, abdomen is tender to palpation all over.  Decreased bowel sounds.  Lungs clear and chest and back are nontender.  No CVA tenderness.  No focal neurologic deficits.  Patient uncomfortable with resting.  Clinically I am concerned about small bowel obstruction or other intra-abdominal pathology in the setting of Crohn's flare.  Will get CT scan as well as labs.  Will get  urine.  We will give the patient pain medicine, nausea medicine, fluids, he will remain n.p.o.  Anticipate reassessment after work-up.  11:24 PM Radiology called to report that patient CT scan shows evidence of partial small bowel obstruction.  Given his nausea vomiting, abdominal pain and Crohn's, will speak to general surgery however anticipate he will need admission to medicine service.  Will discuss steroids for Crohn's flare versus needing antibiotics.  Of note, when patient returned CT scanner, he had edema and  swelling of the right eyelid and face.  He reports that it started after the contrast.  Suspect he has a contrast allergy.  He will be given Benadryl initially through IV.  Anticipate admission for further management.  Medicine team recommended IV Solu-Medrol for the allergic reaction which was ordered.  He will be admitted to medicine for further management.  Still awaiting call from general surgery however I have low suspicion that he need surgery given the partial obstruction at this time.   Final Clinical Impression(s) / ED Diagnoses Final diagnoses:  Partial small bowel obstruction (HCC)  Allergic reaction to contrast material, initial encounter     Clinical Impression: 1. Partial small bowel obstruction ([place])   2. Allergic reaction to contrast material, initial encounter     Disposition: Admit  This note was prepared with assistance of Dragon voice recognition software. Occasional wrong-word or sound-a-like substitutions may have occurred due to the inherent limitations of voice recognition software.     Edoardo Laforte, Gwenyth Allegra, MD 09/16/19 [phone]

## 2019-09-16 ENCOUNTER — Encounter (HOSPITAL_COMMUNITY): Payer: Self-pay | Admitting: Internal Medicine

## 2019-09-16 ENCOUNTER — Inpatient Hospital Stay (HOSPITAL_COMMUNITY): Payer: BC Managed Care – PPO

## 2019-09-16 DIAGNOSIS — K50012 Crohn's disease of small intestine with intestinal obstruction: Secondary | ICD-10-CM | POA: Diagnosis present

## 2019-09-16 DIAGNOSIS — Z20822 Contact with and (suspected) exposure to covid-19: Secondary | ICD-10-CM | POA: Diagnosis present

## 2019-09-16 DIAGNOSIS — K56600 Partial intestinal obstruction, unspecified as to cause: Secondary | ICD-10-CM | POA: Diagnosis not present

## 2019-09-16 DIAGNOSIS — I10 Essential (primary) hypertension: Secondary | ICD-10-CM | POA: Diagnosis not present

## 2019-09-16 DIAGNOSIS — K56609 Unspecified intestinal obstruction, unspecified as to partial versus complete obstruction: Secondary | ICD-10-CM | POA: Diagnosis not present

## 2019-09-16 DIAGNOSIS — T508X5A Adverse effect of diagnostic agents, initial encounter: Secondary | ICD-10-CM | POA: Diagnosis not present

## 2019-09-16 DIAGNOSIS — R933 Abnormal findings on diagnostic imaging of other parts of digestive tract: Secondary | ICD-10-CM | POA: Diagnosis not present

## 2019-09-16 DIAGNOSIS — R1084 Generalized abdominal pain: Secondary | ICD-10-CM | POA: Diagnosis present

## 2019-09-16 LAB — URINALYSIS, ROUTINE W REFLEX MICROSCOPIC
Bilirubin Urine: NEGATIVE
Glucose, UA: NEGATIVE mg/dL
Hgb urine dipstick: NEGATIVE
Ketones, ur: 5 mg/dL — AB
Leukocytes,Ua: NEGATIVE
Nitrite: NEGATIVE
Protein, ur: NEGATIVE mg/dL
Specific Gravity, Urine: 1.026 (ref 1.005–1.030)
pH: 5 (ref 5.0–8.0)

## 2019-09-16 LAB — CBC
HCT: 43.7 % (ref 39.0–52.0)
Hemoglobin: 14.6 g/dL (ref 13.0–17.0)
MCH: 31.1 pg (ref 26.0–34.0)
MCHC: 33.4 g/dL (ref 30.0–36.0)
MCV: 93.2 fL (ref 80.0–100.0)
Platelets: 265 10*3/uL (ref 150–400)
RBC: 4.69 MIL/uL (ref 4.22–5.81)
RDW: 13 % (ref 11.5–15.5)
WBC: 11.9 10*3/uL — ABNORMAL HIGH (ref 4.0–10.5)
nRBC: 0 % (ref 0.0–0.2)

## 2019-09-16 LAB — BASIC METABOLIC PANEL
Anion gap: 8 (ref 5–15)
BUN: 17 mg/dL (ref 6–20)
CO2: 23 mmol/L (ref 22–32)
Calcium: 8.3 mg/dL — ABNORMAL LOW (ref 8.9–10.3)
Chloride: 107 mmol/L (ref 98–111)
Creatinine, Ser: 1.08 mg/dL (ref 0.61–1.24)
GFR calc Af Amer: >60 mL/min (ref >60)
GFR calc non Af Amer: >60 mL/min (ref >60)
Glucose, Bld: 165 mg/dL — ABNORMAL HIGH (ref 70–99)
Potassium: 4.3 mmol/L (ref 3.5–5.1)
Sodium: 138 mmol/L (ref 135–145)

## 2019-09-16 LAB — HEPATIC FUNCTION PANEL
ALT: 17 U/L (ref 0–44)
AST: 16 U/L (ref 15–41)
Albumin: 3.6 g/dL (ref 3.5–5.0)
Alkaline Phosphatase: 58 U/L (ref 38–126)
Bilirubin, Direct: 0.2 mg/dL (ref 0.0–0.2)
Indirect Bilirubin: 1.2 mg/dL — ABNORMAL HIGH (ref 0.3–0.9)
Total Bilirubin: 1.4 mg/dL — ABNORMAL HIGH (ref 0.3–1.2)
Total Protein: 6 g/dL — ABNORMAL LOW (ref 6.5–8.1)

## 2019-09-16 LAB — GLUCOSE, CAPILLARY
Glucose-Capillary: 146 mg/dL — ABNORMAL HIGH (ref 70–99)
Glucose-Capillary: 122 mg/dL — ABNORMAL HIGH (ref 70–99)

## 2019-09-16 LAB — HIV ANTIBODY (ROUTINE TESTING W REFLEX): HIV Screen 4th Generation wRfx: NONREACTIVE

## 2019-09-16 MED ORDER — DEXTROSE-NACL 5-0.9 % IV SOLN: INTRAVENOUS | Status: AC

## 2019-09-16 MED ORDER — FENTANYL CITRATE (PF) 100 MCG/2ML IJ SOLN: 25.0000 ug | INTRAMUSCULAR | Status: DC | PRN

## 2019-09-16 MED ORDER — METHYLPREDNISOLONE SODIUM SUCC 40 MG IJ SOLR
40.0000 mg | Freq: Two times a day (BID) | INTRAMUSCULAR | Status: DC
  Administered 2019-09-16 – 2019-09-17 (×2): 40 mg via INTRAVENOUS
  Filled 2019-09-16 (×2): qty 1

## 2019-09-16 MED ORDER — ACETAMINOPHEN 650 MG RE SUPP: 650.0000 mg | Freq: Four times a day (QID) | RECTAL | Status: DC | PRN

## 2019-09-16 MED ORDER — METHYLPREDNISOLONE SODIUM SUCC 40 MG IJ SOLR
40.0000 mg | Freq: Every day | INTRAMUSCULAR | Status: DC
  Administered 2019-09-16: 40 mg via INTRAVENOUS
  Filled 2019-09-16: qty 1

## 2019-09-16 MED ORDER — ACETAMINOPHEN 325 MG PO TABS
650.0000 mg | ORAL_TABLET | Freq: Four times a day (QID) | ORAL | Status: DC | PRN
  Administered 2019-09-16: 650 mg via ORAL
  Filled 2019-09-16: qty 2

## 2019-09-16 MED ORDER — HYDRALAZINE HCL 20 MG/ML IJ SOLN: 10.0000 mg | INTRAMUSCULAR | Status: DC | PRN

## 2019-09-16 MED ORDER — HEPARIN SODIUM (PORCINE) 5000 UNIT/ML IJ SOLN
5000.0000 [IU] | Freq: Three times a day (TID) | INTRAMUSCULAR | Status: DC
  Administered 2019-09-16 – 2019-09-17 (×4): 5000 [IU] via SUBCUTANEOUS
  Filled 2019-09-16 (×4): qty 1

## 2019-09-16 MED ORDER — ONDANSETRON HCL 4 MG/2ML IJ SOLN: 4.0000 mg | Freq: Four times a day (QID) | INTRAMUSCULAR | Status: DC | PRN

## 2019-09-16 MED ORDER — ONDANSETRON HCL 4 MG PO TABS: 4.0000 mg | ORAL_TABLET | Freq: Four times a day (QID) | ORAL | Status: DC | PRN

## 2019-09-16 NOTE — Progress Notes (Signed)
PROGRESS NOTE    Vernon Rollins  NKN:[id number] DOB: Sep 06, 1965 DOA: 09/15/2019 PCP: Gweneth Dimitri, MD    Brief Narrative:  54 y.o. male with history of Crohn's disease on mesalamine being followed by Dr. Bosie Clos gastroenterologist presents to the ER with complaints of abdominal pain since yesterday morning.  Has had multiple episodes of vomiting.  Denies any blood in the vomit.  Last bowel movement was yesterday morning.  Which was normal.  Denies any chest pain shortness of breath fever chills.  Assessment & Plan:   Principal Problem:   SBO (small bowel obstruction) (HCC) Active Problems:   Allergic reaction to contrast dye   Essential hypertension  1. Partial small bowel obstruction with active crohn's ileitis  1. CT abd and pelvis as well as follow up abd xray reviewed. Findings concerning of distal SBO with related active ileitis in a pt with known Crohn's 2. Pt reports feeling better with IV steroids overnight 3. Appreciate input by GI. Recommendation to continue clear liquids tonight and continue IV steroids 4. If continued improvement, then possibility for advancing diet and changing to po meds tomorrow 2. Allergic reaction to IV dye for which patient received IV Solu-Medrol and Benadryl.   1. Stable at this time 2. Continue to monitor 3. Hypertension 1. Will continue home cozaar as pt now on clears 2. Currently on PRN hydralazine 4. Leukocytosis 1. Improved 2. No evidence of active infection  DVT prophylaxis: Heparin subq Code Status: Full Family Communication: Pt in room, family not at bedside Disposition Plan: Possible home in 24-48hrs when tolerating PO and cleared by GI  Consultants:   GI  Procedures:     Antimicrobials: Anti-infectives (From admission, onward)   None       Subjective: Feeling better today  Objective: Vitals:   09/16/19 0130 09/16/19 0200 09/16/19 0258 09/16/19 1333  BP: 122/73 115/71 118/74 110/70  Pulse: 64 66 66 65    Resp: 18  18 17   Temp:   98.1 F (36.7 C) 98.3 F (36.8 C)  TempSrc:   Oral Oral  SpO2: 94% 93% 96% 96%  Weight:      Height:        Intake/Output Summary (Last 24 hours) at 09/16/2019 1632 Last data filed at 09/16/2019 1300 Gross per 24 hour  Intake 139.73 ml  Output 325 ml  Net -185.27 ml   Filed Weights   09/15/19 1724  Weight: 81.6 kg    Examination:  General exam: Appears calm and comfortable  Respiratory system: Clear to auscultation. Respiratory effort normal. Cardiovascular system: S1 & S2 heard, Regular Gastrointestinal system: Abdomen is nondistended, soft and nontender. No organomegaly or masses felt. Normal bowel sounds heard. Central nervous system: Alert and oriented. No focal neurological deficits. Extremities: Symmetric 5 x 5 power. Skin: No rashes, lesions Psychiatry: Judgement and insight appear normal. Mood & affect appropriate.   Data Reviewed: I have personally reviewed following labs and imaging studies  CBC: Recent Labs  Lab 09/15/19 1735 09/16/19 0323  WBC 18.1* 11.9*  HGB 16.1 14.6  HCT 47.5 43.7  MCV 90.5 93.2  PLT 315 265   Basic Metabolic Panel: Recent Labs  Lab 09/15/19 1735 09/16/19 0323  NA 138 138  K 4.3 4.3  CL 104 107  CO2 24 23  GLUCOSE 113* 165*  BUN 16 17  CREATININE 1.07 1.08  CALCIUM 9.8 8.3*   GFR: Estimated Creatinine Clearance: 76.5 mL/min (by C-G formula based on SCr of 1.08 mg/dL). Liver Function Tests: Recent  Labs  Lab 09/15/19 1735 09/16/19 0323  AST 23 16  ALT 22 17  ALKPHOS 66 58  BILITOT 1.4* 1.4*  PROT 7.4 6.0*  ALBUMIN 4.4 3.6   Recent Labs  Lab 09/15/19 1735  LIPASE 25   No results for input(s): AMMONIA in the last 168 hours. Coagulation Profile: No results for input(s): INR, PROTIME in the last 168 hours. Cardiac Enzymes: No results for input(s): CKTOTAL, CKMB, CKMBINDEX, TROPONINI in the last 168 hours. BNP (last 3 results) No results for input(s): PROBNP in the last 8760  hours. HbA1C: No results for input(s): HGBA1C in the last 72 hours. CBG: Recent Labs  Lab 09/16/19 0742 09/16/19 1604  GLUCAP 146* 122*   Lipid Profile: No results for input(s): CHOL, HDL, LDLCALC, TRIG, CHOLHDL, LDLDIRECT in the last 72 hours. Thyroid Function Tests: No results for input(s): TSH, T4TOTAL, FREET4, T3FREE, THYROIDAB in the last 72 hours. Anemia Panel: No results for input(s): VITAMINB12, FOLATE, FERRITIN, TIBC, IRON, RETICCTPCT in the last 72 hours. Sepsis Labs: Recent Labs  Lab 09/15/19 2145  LATICACIDVEN 0.7    Recent Results (from the past 240 hour(s))  Respiratory Panel by RT PCR (Flu A&B, Covid) - Nasopharyngeal Swab     Status: None   Collection Time: 09/15/19  9:45 PM   Specimen: Nasopharyngeal Swab  Result Value Ref Range Status   SARS Coronavirus 2 by RT PCR NEGATIVE NEGATIVE Final    Comment: (NOTE) SARS-CoV-2 target nucleic acids are NOT DETECTED. The SARS-CoV-2 RNA is generally detectable in upper respiratoy specimens during the acute phase of infection. The lowest concentration of SARS-CoV-2 viral copies this assay can detect is 131 copies/mL. A negative result does not preclude SARS-Cov-2 infection and should not be used as the sole basis for treatment or other patient management decisions. A negative result may occur with  improper specimen collection/handling, submission of specimen other than nasopharyngeal swab, presence of viral mutation(s) within the areas targeted by this assay, and inadequate number of viral copies (<131 copies/mL). A negative result must be combined with clinical observations, patient history, and epidemiological information. The expected result is Negative. Fact Sheet for Patients:  PinkCheek.be Fact Sheet for Healthcare Providers:  GravelBags.it This test is not yet ap proved or cleared by the [place] FDA and  has been authorized for detection and/or  diagnosis of SARS-CoV-2 by FDA under an Emergency Use Authorization (EUA). This EUA will remain  in effect (meaning this test can be used) for the duration of the COVID-19 declaration under Section 564(b)(1) of the Act, 21 U.S.C. section 360bbb-3(b)(1), unless the authorization is terminated or revoked sooner.    Influenza A by PCR NEGATIVE NEGATIVE Final   Influenza B by PCR NEGATIVE NEGATIVE Final    Comment: (NOTE) The Xpert Xpress SARS-CoV-2/FLU/RSV assay is intended as an aid in  the diagnosis of influenza from Nasopharyngeal swab specimens and  should not be used as a sole basis for treatment. Nasal washings and  aspirates are unacceptable for Xpert Xpress SARS-CoV-2/FLU/RSV  testing. Fact Sheet for Patients: PinkCheek.be Fact Sheet for Healthcare Providers: GravelBags.it This test is not yet approved or cleared by the [place] FDA and  has been authorized for detection and/or diagnosis of SARS-CoV-2 by  FDA under an Emergency Use Authorization (EUA). This EUA will remain  in effect (meaning this test can be used) for the duration of the  Covid-19 declaration under Section 564(b)(1) of the Act, 21  U.S.C. section 360bbb-3(b)(1), unless the authorization is  terminated  or revoked. Performed at [facility], [address] [place], [place] [address]      Radiology Studies: DG Abd 1 View  Result Date: 09/16/2019 CLINICAL DATA:  Follow-up small bowel obstruction EXAM: ABDOMEN - 1 VIEW COMPARISON:  CT yesterday FINDINGS: Stable bowel gas pattern compared to scanogram with underestimated small bowel distention when compared with prior CT. There is underlying small bowel thickening distally in this patient with history of Crohn's. No concerning mass effect or gas collection. IMPRESSION: Stable compared to abdominal CT yesterday. There is partial small bowel obstruction related to active ileitis or  fibrostenosis in this patient with history of Crohn's. Electronically Signed   By: Marnee Spring M.D.   On: 09/16/2019 07:49   CT ABDOMEN PELVIS W CONTRAST  Result Date: 09/15/2019 CLINICAL DATA:  Abdominal pain with history of Crohn's disease. EXAM: CT ABDOMEN AND PELVIS WITH CONTRAST TECHNIQUE: Multidetector CT imaging of the abdomen and pelvis was performed using the standard protocol following bolus administration of intravenous contrast. CONTRAST:  OMNIPAQUE IOHEXOL 300 MG/ML  SOLN COMPARISON:  November 14, 2013 FINDINGS: Lower chest: No acute abnormality. Hepatobiliary: No focal liver abnormality is seen. No gallstones, gallbladder wall thickening, or biliary dilatation. Pancreas: Unremarkable. No pancreatic ductal dilatation or surrounding inflammatory changes. Spleen: Normal in size without focal abnormality. Adrenals/Urinary Tract: Adrenal glands are unremarkable. Kidneys are normal, without renal calculi, focal lesion, or hydronephrosis. Bladder is unremarkable. Stomach/Bowel: Stomach is within normal limits. The appendix is not clearly identified. Multiple dilated small bowel loops are seen within the mid and lower abdomen (maximum small bowel diameter of approximately 3.2 cm). An abrupt transition zone is seen within the anterior aspect of the right lower quadrant (axial CT images 48 through 53, CT series number 3). Mild thickening of the terminal ileum is noted. Vascular/Lymphatic: Mild aortic atherosclerosis. No enlarged abdominal or pelvic lymph nodes. Reproductive: Prostate is unremarkable. Other: A 3.9 cm x 2.6 cm right inguinal hernia is seen. An additional 3.3 cm x 2.5 cm left inguinal hernia is noted. Both hernias contain fat and fluid. A small amount of posterior pelvic free fluid is seen. Musculoskeletal: No acute or significant osseous findings. IMPRESSION: 1. Findings consistent with a distal partial small bowel obstruction. 2. Mild thickening of the terminal ileum. Mild inflammation  involving this portion of distal ileum cannot be excluded. Electronically Signed   By: Aram Candela M.D.   On: 09/15/2019 22:37    Scheduled Meds:  heparin  5,000 Units Subcutaneous Q8H   methylPREDNISolone (SOLU-MEDROL) injection  40 mg Intravenous Q12H   sodium chloride flush  3 mL Intravenous Once   Continuous Infusions:  dextrose 5 % and 0.9% NaCl 75 mL/hr at 09/16/19 0500     LOS: 0 days   Rickey Barbara, MD [facility] Pager On Amion  If 7PM-7AM, please contact night-coverage 09/16/2019, 4:32 PM

## 2019-09-16 NOTE — H&P (Signed)
History and Physical    Vernon Rollins TDD:[id number] DOB: Dec 30, 1965 DOA: 09/15/2019  PCP: Cari Caraway, MD  Patient coming from: Home.  Chief Complaint: Abdominal pain nausea vomiting.  HPI: Vernon Rollins is a 54 y.o. male with history of Crohn's disease on mesalamine being followed by Dr. Michail Sermon gastroenterologist presents to the ER with complaints of abdominal pain since yesterday morning.  Has had multiple episodes of vomiting.  Denies any blood in the vomit.  Last bowel movement was yesterday morning.  Which was normal.  Denies any chest pain shortness of breath fever chills.  ED Course: In the ER patient had a CAT scan abdomen pelvis which shows partial small bowel obstruction features.  Also found in the CAT scan was mild inflammation of the terminal ileum.  After the CAT scan patient developed swelling of his right eye which was likely from an allergic reaction to the IV dye.  Patient was given Solu-Medrol and IV Benadryl following which his swelling in the right eye improved.  On my exam patient's abdomen is mildly distended with no bowel sounds appreciated.  Labs show total bilirubin 1.4 WBC count of 18.1 Covid test was negative.  Review of Systems: As per HPI, rest all negative.   Past Medical History:  Diagnosis Date  . Crohn disease [facility])     Past Surgical History:  Procedure Laterality Date  . SHOULDER SURGERY       reports that he has never smoked. He has never used smokeless tobacco. He reports that he does not drink alcohol or use drugs.  Allergies  Allergen Reactions  . Iohexol Swelling    Right orbital swelling post injection of IV contrast for CT scan of abd/pel.  Patient will need pre-med    Family History  Problem Relation Age of Onset  . Crohn's disease Neg Hx     Prior to Admission medications   Medication Sig Start Date End Date Taking? Authorizing Provider  albuterol (PROVENTIL HFA;VENTOLIN HFA) 108 (90 BASE) MCG/ACT inhaler Inhale 2 puffs  into the lungs every 6 (six) hours as needed for wheezing or shortness of breath.   Yes [provider]  escitalopram (LEXAPRO) 20 MG tablet Take 20 mg by mouth every morning.   Yes [provider]  losartan (COZAAR) 100 MG tablet Take 100 mg by mouth daily. 07/27/19  Yes [provider]  mesalamine (APRISO) 0.375 g 24 hr capsule Take 0.75 g by mouth daily. Take 2 capsules (0.75 mg) 07/21/19  Yes [provider]  ciprofloxacin (CIPRO) 500 MG tablet Take 1 tablet (500 mg total) by mouth 2 (two) times daily. Patient not taking: Reported on 09/15/2019 11/14/13   Delos Haring, PA-C  metroNIDAZOLE (FLAGYL) 500 MG tablet Take 1 tablet (500 mg total) by mouth 2 (two) times daily. Patient not taking: Reported on 09/15/2019 11/14/13   Delos Haring, PA-C  ondansetron (ZOFRAN) 4 MG tablet Take 1 tablet (4 mg total) by mouth every 6 (six) hours. Patient not taking: Reported on 09/15/2019 11/14/13   Delos Haring, PA-C  oxyCODONE-acetaminophen (PERCOCET/ROXICET) 5-325 MG per tablet Take 1-2 tablets by mouth every 6 (six) hours as needed for severe pain. Patient not taking: Reported on 09/15/2019 11/14/13   Delos Haring, PA-C    Physical Exam: Constitutional: Moderately built and nourished. Vitals:   09/16/19 0100 09/16/19 0130 09/16/19 0200 09/16/19 0258  BP: 117/77 122/73 115/71 118/74  Pulse: 71 64 66 66  Resp: 18 18  18   Temp:    98.1  F (36.7 C)  TempSrc:    Oral  SpO2: 94% 94% 93% 96%  Weight:      Height:       Eyes: Anicteric no pallor. ENMT: No discharge from the ears eyes nose or mouth. Neck: No mass felt.  No neck rigidity. Respiratory: No rhonchi or crepitations. Cardiovascular: S1-S2 heard. Abdomen: Soft mildly distended nontender bowel sounds not appreciated.  No guarding no rigidity no rebound tenderness. Musculoskeletal: No edema. Skin: No rash. Neurologic: Alert awake oriented to time place and person.  Moves all extremities. Psychiatric:  Appears normal per normal affect.   Labs on Admission: I have personally reviewed following labs and imaging studies  CBC: Recent Labs  Lab 09/15/19 1735  WBC 18.1*  HGB 16.1  HCT 47.5  MCV 90.5  PLT 315   Basic Metabolic Panel: Recent Labs  Lab 09/15/19 1735  NA 138  K 4.3  CL 104  CO2 24  GLUCOSE 113*  BUN 16  CREATININE 1.07  CALCIUM 9.8   GFR: Estimated Creatinine Clearance: 77.2 mL/min (by C-G formula based on SCr of 1.07 mg/dL). Liver Function Tests: Recent Labs  Lab 09/15/19 1735  AST 23  ALT 22  ALKPHOS 66  BILITOT 1.4*  PROT 7.4  ALBUMIN 4.4   Recent Labs  Lab 09/15/19 1735  LIPASE 25   No results for input(s): AMMONIA in the last 168 hours. Coagulation Profile: No results for input(s): INR, PROTIME in the last 168 hours. Cardiac Enzymes: No results for input(s): CKTOTAL, CKMB, CKMBINDEX, TROPONINI in the last 168 hours. BNP (last 3 results) No results for input(s): PROBNP in the last 8760 hours. HbA1C: No results for input(s): HGBA1C in the last 72 hours. CBG: No results for input(s): GLUCAP in the last 168 hours. Lipid Profile: No results for input(s): CHOL, HDL, LDLCALC, TRIG, CHOLHDL, LDLDIRECT in the last 72 hours. Thyroid Function Tests: No results for input(s): TSH, T4TOTAL, FREET4, T3FREE, THYROIDAB in the last 72 hours. Anemia Panel: No results for input(s): VITAMINB12, FOLATE, FERRITIN, TIBC, IRON, RETICCTPCT in the last 72 hours. Urine analysis:    Component Value Date/Time   COLORURINE YELLOW 11/14/2013 1752   APPEARANCEUR CLEAR 11/14/2013 1752   LABSPEC 1.024 11/14/2013 1752   PHURINE 5.5 11/14/2013 1752   GLUCOSEU NEGATIVE 11/14/2013 1752   HGBUR NEGATIVE 11/14/2013 1752   BILIRUBINUR NEGATIVE 11/14/2013 1752   KETONESUR NEGATIVE 11/14/2013 1752   PROTEINUR NEGATIVE 11/14/2013 1752   UROBILINOGEN 1.0 11/14/2013 1752   NITRITE NEGATIVE 11/14/2013 1752   LEUKOCYTESUR NEGATIVE 11/14/2013 1752   Sepsis  Labs: @LABRCNTIP (procalcitonin:4,lacticidven:4) ) Recent Results (from the past 240 hour(s))  Respiratory Panel by RT PCR (Flu A&B, Covid) - Nasopharyngeal Swab     Status: None   Collection Time: 09/15/19  9:45 PM   Specimen: Nasopharyngeal Swab  Result Value Ref Range Status   SARS Coronavirus 2 by RT PCR NEGATIVE NEGATIVE Final    Comment: (NOTE) SARS-CoV-2 target nucleic acids are NOT DETECTED. The SARS-CoV-2 RNA is generally detectable in upper respiratoy specimens during the acute phase of infection. The lowest concentration of SARS-CoV-2 viral copies this assay can detect is 131 copies/mL. A negative result does not preclude SARS-Cov-2 infection and should not be used as the sole basis for treatment or other patient management decisions. A negative result may occur with  improper specimen collection/handling, submission of specimen other than nasopharyngeal swab, presence of viral mutation(s) within the areas targeted by this assay, and inadequate number of viral copies (<131 copies/mL).  A negative result must be combined with clinical observations, patient history, and epidemiological information. The expected result is Negative. Fact Sheet for Patients:  https://www.moore.com/ Fact Sheet for Healthcare Providers:  https://www.young.biz/ This test is not yet ap proved or cleared by the [place] FDA and  has been authorized for detection and/or diagnosis of SARS-CoV-2 by FDA under an Emergency Use Authorization (EUA). This EUA will remain  in effect (meaning this test can be used) for the duration of the COVID-19 declaration under Section 564(b)(1) of the Act, 21 U.S.C. section 360bbb-3(b)(1), unless the authorization is terminated or revoked sooner.    Influenza A by PCR NEGATIVE NEGATIVE Final   Influenza B by PCR NEGATIVE NEGATIVE Final    Comment: (NOTE) The Xpert Xpress SARS-CoV-2/FLU/RSV assay is intended as an aid in  the  diagnosis of influenza from Nasopharyngeal swab specimens and  should not be used as a sole basis for treatment. Nasal washings and  aspirates are unacceptable for Xpert Xpress SARS-CoV-2/FLU/RSV  testing. Fact Sheet for Patients: https://www.moore.com/ Fact Sheet for Healthcare Providers: https://www.young.biz/ This test is not yet approved or cleared by the [place] FDA and  has been authorized for detection and/or diagnosis of SARS-CoV-2 by  FDA under an Emergency Use Authorization (EUA). This EUA will remain  in effect (meaning this test can be used) for the duration of the  Covid-19 declaration under Section 564(b)(1) of the Act, 21  U.S.C. section 360bbb-3(b)(1), unless the authorization is  terminated or revoked. Performed at [facility], [address], [place], [place] [address]      Radiological Exams on Admission: CT ABDOMEN PELVIS W CONTRAST  Result Date: 09/15/2019 CLINICAL DATA:  Abdominal pain with history of Crohn's disease. EXAM: CT ABDOMEN AND PELVIS WITH CONTRAST TECHNIQUE: Multidetector CT imaging of the abdomen and pelvis was performed using the standard protocol following bolus administration of intravenous contrast. CONTRAST:  OMNIPAQUE IOHEXOL 300 MG/ML  SOLN COMPARISON:  November 14, 2013 FINDINGS: Lower chest: No acute abnormality. Hepatobiliary: No focal liver abnormality is seen. No gallstones, gallbladder wall thickening, or biliary dilatation. Pancreas: Unremarkable. No pancreatic ductal dilatation or surrounding inflammatory changes. Spleen: Normal in size without focal abnormality. Adrenals/Urinary Tract: Adrenal glands are unremarkable. Kidneys are normal, without renal calculi, focal lesion, or hydronephrosis. Bladder is unremarkable. Stomach/Bowel: Stomach is within normal limits. The appendix is not clearly identified. Multiple dilated small bowel loops are seen within the mid and lower  abdomen (maximum small bowel diameter of approximately 3.2 cm). An abrupt transition zone is seen within the anterior aspect of the right lower quadrant (axial CT images 48 through 53, CT series number 3). Mild thickening of the terminal ileum is noted. Vascular/Lymphatic: Mild aortic atherosclerosis. No enlarged abdominal or pelvic lymph nodes. Reproductive: Prostate is unremarkable. Other: A 3.9 cm x 2.6 cm right inguinal hernia is seen. An additional 3.3 cm x 2.5 cm left inguinal hernia is noted. Both hernias contain fat and fluid. A small amount of posterior pelvic free fluid is seen. Musculoskeletal: No acute or significant osseous findings. IMPRESSION: 1. Findings consistent with a distal partial small bowel obstruction. 2. Mild thickening of the terminal ileum. Mild inflammation involving this portion of distal ileum cannot be excluded. Electronically Signed   By: Aram Candela M.D.   On: 09/15/2019 22:37     Assessment/Plan Principal Problem:   SBO (small bowel obstruction) (HCC) Active Problems:   Allergic reaction to contrast dye   Essential hypertension    1.  Partial small bowel obstruction -given that patient has some inflammation of the terminal ileum suspect cause could be patient's known history of Crohn's disease.  Did receive 1 dose of Solu-Medrol in the ER.  We will continue on IV Solu-Medrol for now and consult gastroenterologist.  For now I am keeping patient n.p.o.  Check KUB in the morning.  IV fluids pain relief medications.  Since patient has not thrown up after coming to the ER I have not placed patient on NG tube.  May need surgical input. 2. Allergic reaction to IV dye for which patient received IV Solu-Medrol and Benadryl.  Patient made aware.  On exam patient had no wheezing no tongue swelling.  His right eye swelling has resolved. 3. Hypertension we will keep patient on as needed IV hydralazine since patient is n.p.o. 4. Leukocytosis could be reactionary no definite  evidence of any infection at this time.  Closely monitor.  Since patient has small bowel obstruction will need inpatient status for close monitoring for any deterioration.   DVT prophylaxis: Heparin. Code Status: Full code. Family Communication: Discussed with patient. Disposition Plan: Home. Consults called: None. Admission status: Inpatient.   Eduard Clos MD [facility] Pager [phone].  If 7PM-7AM, please contact night-coverage [email or web address] Password [facility]  09/16/2019, 2:59 AM

## 2019-09-16 NOTE — Consult Note (Signed)
Reason for Consult: Crohn's with partial small bowel obstruction  Referring Physician: Hospital team  Vernon Rollins is an 54 y.o. male.  HPI: Patient was in his normal state of health until yesterday when he had right lower quadrant pain and increased nausea and vomiting and presented to the emergency room and was admitted for Crohn's flare with partial bowel obstruction he is much better today on a few doses of steroids and we are consulted for further work-up and plans his hospital computer chart and our office computer chart was reviewed and I have discussed the case with both the hospital team and his wife as well he has not been losing weight has not had any other symptoms like fever chills night sweats eye skin or joint complaints and has had at least 2 colonoscopies the last one in September both of which showed mild ileitis and he has been well maintained on 5-ASA only up until now and he does eat a lot of popcorn which we discussed but has not had any bowel complaints of bleeding etc.  Past Medical History:  Diagnosis Date  . Crohn disease [facility])     Past Surgical History:  Procedure Laterality Date  . SHOULDER SURGERY      Family History  Problem Relation Age of Onset  . Crohn's disease Neg Hx     Social History:  reports that he has never smoked. He has never used smokeless tobacco. He reports that he does not drink alcohol or use drugs.  Allergies:  Allergies  Allergen Reactions  . Iohexol Swelling    Right orbital swelling post injection of IV contrast for CT scan of abd/pel.  Patient will need pre-med    Medications: I have reviewed the patient's current medications.  Results for orders placed or performed during the hospital encounter of 09/15/19 (from the past 48 hour(s))  Lipase, blood     Status: None   Collection Time: 09/15/19  5:35 PM  Result Value Ref Range   Lipase 25 11 - 51 U/L    Comment: Performed at [facility], [address], [place], [place] [address]  Comprehensive metabolic panel     Status: Abnormal   Collection Time: 09/15/19  5:35 PM  Result Value Ref Range   Sodium 138 135 - 145 mmol/L   Potassium 4.3 3.5 - 5.1 mmol/L   Chloride 104 98 - 111 mmol/L   CO2 24 22 - 32 mmol/L   Glucose, Bld 113 (H) 70 - 99 mg/dL   BUN 16 6 - 20 mg/dL   Creatinine, Ser 8.75 0.61 - 1.24 mg/dL   Calcium 9.8 8.9 - 64.3 mg/dL   Total Protein 7.4 6.5 - 8.1 g/dL   Albumin 4.4 3.5 - 5.0 g/dL   AST 23 15 - 41 U/L   ALT 22 0 - 44 U/L   Alkaline Phosphatase 66 38 - 126 U/L   Total Bilirubin 1.4 (H) 0.3 - 1.2 mg/dL   GFR calc non Af Amer >60 >60 mL/min   GFR calc Af Amer >60 >60 mL/min   Anion gap 10 5 - 15    Comment: Performed at [facility], [address], [place], [place] [address]  CBC     Status: Abnormal   Collection Time: 09/15/19  5:35 PM  Result Value Ref Range   WBC 18.1 (H) 4.0 - 10.5 K/uL   RBC 5.25 4.22 - 5.81 MIL/uL   Hemoglobin 16.1 13.0 - 17.0 g/dL  HCT 47.5 39.0 - 52.0 %   MCV 90.5 80.0 - 100.0 fL   MCH 30.7 26.0 - 34.0 pg   MCHC 33.9 30.0 - 36.0 g/dL   RDW 01.4 10.3 - 01.3 %   Platelets 315 150 - 400 K/uL   nRBC 0.0 0.0 - 0.2 %    Comment: Performed at [facility], [address], [place], [place] [address]  Respiratory Panel by RT PCR (Flu A&B, Covid) - Nasopharyngeal Swab     Status: None   Collection Time: 09/15/19  9:45 PM   Specimen: Nasopharyngeal Swab  Result Value Ref Range   SARS Coronavirus 2 by RT PCR NEGATIVE NEGATIVE    Comment: (NOTE) SARS-CoV-2 target nucleic acids are NOT DETECTED. The SARS-CoV-2 RNA is generally detectable in upper respiratoy specimens during the acute phase of infection. The lowest concentration of SARS-CoV-2 viral copies this assay can detect is 131 copies/mL. A negative result does not preclude SARS-Cov-2 infection and should not be used as the sole basis for treatment or other patient management decisions. A negative  result may occur with  improper specimen collection/handling, submission of specimen other than nasopharyngeal swab, presence of viral mutation(s) within the areas targeted by this assay, and inadequate number of viral copies (<131 copies/mL). A negative result must be combined with clinical observations, patient history, and epidemiological information. The expected result is Negative. Fact Sheet for Patients:  https://www.moore.com/ Fact Sheet for Healthcare Providers:  https://www.young.biz/ This test is not yet ap proved or cleared by the [place] FDA and  has been authorized for detection and/or diagnosis of SARS-CoV-2 by FDA under an Emergency Use Authorization (EUA). This EUA will remain  in effect (meaning this test can be used) for the duration of the COVID-19 declaration under Section 564(b)(1) of the Act, 21 U.S.C. section 360bbb-3(b)(1), unless the authorization is terminated or revoked sooner.    Influenza A by PCR NEGATIVE NEGATIVE   Influenza B by PCR NEGATIVE NEGATIVE    Comment: (NOTE) The Xpert Xpress SARS-CoV-2/FLU/RSV assay is intended as an aid in  the diagnosis of influenza from Nasopharyngeal swab specimens and  should not be used as a sole basis for treatment. Nasal washings and  aspirates are unacceptable for Xpert Xpress SARS-CoV-2/FLU/RSV  testing. Fact Sheet for Patients: https://www.moore.com/ Fact Sheet for Healthcare Providers: https://www.young.biz/ This test is not yet approved or cleared by the [place] FDA and  has been authorized for detection and/or diagnosis of SARS-CoV-2 by  FDA under an Emergency Use Authorization (EUA). This EUA will remain  in effect (meaning this test can be used) for the duration of the  Covid-19 declaration under Section 564(b)(1) of the Act, 21  U.S.C. section 360bbb-3(b)(1), unless the authorization is  terminated or  revoked. Performed at [facility], [address] [place], [place] [address]   Lactic acid, plasma     Status: None   Collection Time: 09/15/19  9:45 PM  Result Value Ref Range   Lactic Acid, Venous 0.7 0.5 - 1.9 mmol/L    Comment: Performed at [facility], [address], [place], [place] [address]  Basic metabolic panel     Status: Abnormal   Collection Time: 09/16/19  3:23 AM  Result Value Ref Range   Sodium 138 135 - 145 mmol/L   Potassium 4.3 3.5 - 5.1 mmol/L   Chloride 107 98 - 111 mmol/L   CO2 23 22 - 32 mmol/L   Glucose, Bld 165 (H)  70 - 99 mg/dL   BUN 17 6 - 20 mg/dL   Creatinine, Ser 1.08 0.61 - 1.24 mg/dL   Calcium 8.3 (L) 8.9 - 10.3 mg/dL   GFR calc non Af Amer >60 >60 mL/min   GFR calc Af Amer >60 >60 mL/min   Anion gap 8 5 - 15    Comment: Performed at [facility], [place] [address] [place], [place] [address]  CBC     Status: Abnormal   Collection Time: 09/16/19  3:23 AM  Result Value Ref Range   WBC 11.9 (H) 4.0 - 10.5 K/uL   RBC 4.69 4.22 - 5.81 MIL/uL   Hemoglobin 14.6 13.0 - 17.0 g/dL   HCT 43.7 39.0 - 52.0 %   MCV 93.2 80.0 - 100.0 fL   MCH 31.1 26.0 - 34.0 pg   MCHC 33.4 30.0 - 36.0 g/dL   RDW 13.0 11.5 - 15.5 %   Platelets 265 150 - 400 K/uL   nRBC 0.0 0.0 - 0.2 %    Comment: Performed at [facility], [place] [address] [place], [place] [address]  Hepatic function panel     Status: Abnormal   Collection Time: 09/16/19  3:23 AM  Result Value Ref Range   Total Protein 6.0 (L) 6.5 - 8.1 g/dL   Albumin 3.6 3.5 - 5.0 g/dL   AST 16 15 - 41 U/L   ALT 17 0 - 44 U/L   Alkaline Phosphatase 58 38 - 126 U/L   Total Bilirubin 1.4 (H) 0.3 - 1.2 mg/dL   Bilirubin, Direct 0.2 0.0 - 0.2 mg/dL   Indirect Bilirubin 1.2 (H) 0.3 - 0.9 mg/dL    Comment: Performed at [facility], [place] [address], [place], [place] [address]  HIV Antibody (routine testing w rflx)     Status:  None   Collection Time: 09/16/19  4:52 AM  Result Value Ref Range   HIV Screen 4th Generation wRfx NON REACTIVE NON REACTIVE    Comment: Performed at [facility] [address], [place], [place] [address]  Glucose, capillary     Status: Abnormal   Collection Time: 09/16/19  7:42 AM  Result Value Ref Range   Glucose-Capillary 146 (H) 70 - 99 mg/dL  Urinalysis, Routine w reflex microscopic     Status: Abnormal   Collection Time: 09/16/19  7:44 AM  Result Value Ref Range   Color, Urine YELLOW YELLOW   APPearance CLEAR CLEAR   Specific Gravity, Urine 1.026 1.005 - 1.030   pH 5.0 5.0 - 8.0   Glucose, UA NEGATIVE NEGATIVE mg/dL   Hgb urine dipstick NEGATIVE NEGATIVE   Bilirubin Urine NEGATIVE NEGATIVE   Ketones, ur 5 (A) NEGATIVE mg/dL   Protein, ur NEGATIVE NEGATIVE mg/dL   Nitrite NEGATIVE NEGATIVE   Leukocytes,Ua NEGATIVE NEGATIVE    Comment: Performed at [facility] [address], [place], [place] [address]    DG Abd 1 View  Result Date: 09/16/2019 CLINICAL DATA:  Follow-up small bowel obstruction EXAM: ABDOMEN - 1 VIEW COMPARISON:  CT yesterday FINDINGS: Stable bowel gas pattern compared to scanogram with underestimated small bowel distention when compared with prior CT. There is underlying small bowel thickening distally in this patient with history of Crohn's. No concerning mass effect or gas collection. IMPRESSION: Stable compared to abdominal CT yesterday. There is partial small bowel obstruction related to active ileitis or fibrostenosis in this patient with history of Crohn's. Electronically Signed  By: Marnee Spring M.D.   On: 09/16/2019 07:49   CT ABDOMEN PELVIS W CONTRAST  Result Date: 09/15/2019 CLINICAL DATA:  Abdominal pain with history of Crohn's disease. EXAM: CT ABDOMEN AND PELVIS WITH CONTRAST TECHNIQUE: Multidetector CT imaging of the abdomen and pelvis was performed using the standard protocol following bolus administration of  intravenous contrast. CONTRAST:  OMNIPAQUE IOHEXOL 300 MG/ML  SOLN COMPARISON:  November 14, 2013 FINDINGS: Lower chest: No acute abnormality. Hepatobiliary: No focal liver abnormality is seen. No gallstones, gallbladder wall thickening, or biliary dilatation. Pancreas: Unremarkable. No pancreatic ductal dilatation or surrounding inflammatory changes. Spleen: Normal in size without focal abnormality. Adrenals/Urinary Tract: Adrenal glands are unremarkable. Kidneys are normal, without renal calculi, focal lesion, or hydronephrosis. Bladder is unremarkable. Stomach/Bowel: Stomach is within normal limits. The appendix is not clearly identified. Multiple dilated small bowel loops are seen within the mid and lower abdomen (maximum small bowel diameter of approximately 3.2 cm). An abrupt transition zone is seen within the anterior aspect of the right lower quadrant (axial CT images 48 through 53, CT series number 3). Mild thickening of the terminal ileum is noted. Vascular/Lymphatic: Mild aortic atherosclerosis. No enlarged abdominal or pelvic lymph nodes. Reproductive: Prostate is unremarkable. Other: A 3.9 cm x 2.6 cm right inguinal hernia is seen. An additional 3.3 cm x 2.5 cm left inguinal hernia is noted. Both hernias contain fat and fluid. A small amount of posterior pelvic free fluid is seen. Musculoskeletal: No acute or significant osseous findings. IMPRESSION: 1. Findings consistent with a distal partial small bowel obstruction. 2. Mild thickening of the terminal ileum. Mild inflammation involving this portion of distal ileum cannot be excluded. Electronically Signed   By: Aram Candela M.D.   On: 09/15/2019 22:37    Review of Systems negative except above Blood pressure 110/70, pulse 65, temperature 98.3 F (36.8 C), temperature source Oral, resp. rate 17, height 5\' 8"  (1.727 m), weight 81.6 kg, SpO2 96 %. Physical Exam vital signs stable afebrile no acute distress exam pertinent for his abdomen  being soft nontender no guarding or rebound normal bowel sounds CT and labs reviewed Assessment/Plan: Crohn's with partial small bowel obstruction patient looks way better than story sounds Plan: I think if he tolerates clear liquid diet overnight he can have soft solids in the morning and if that is tolerated he can go home on a prednisone taper with follow-up in the office in a week or 2 with his primary gastroenterologist Dr. and they can discuss at that time questionably more aggressive management which we briefly discussed with he and his wife  [facility] E 09/16/2019, 2:46 PM

## 2019-09-17 DIAGNOSIS — K56609 Unspecified intestinal obstruction, unspecified as to partial versus complete obstruction: Secondary | ICD-10-CM

## 2019-09-17 LAB — COMPREHENSIVE METABOLIC PANEL
ALT: 16 U/L (ref 0–44)
AST: 14 U/L — ABNORMAL LOW (ref 15–41)
Albumin: 3.4 g/dL — ABNORMAL LOW (ref 3.5–5.0)
Alkaline Phosphatase: 54 U/L (ref 38–126)
Anion gap: 4 — ABNORMAL LOW (ref 5–15)
BUN: 15 mg/dL (ref 6–20)
CO2: 26 mmol/L (ref 22–32)
Calcium: 8.3 mg/dL — ABNORMAL LOW (ref 8.9–10.3)
Chloride: 110 mmol/L (ref 98–111)
Creatinine, Ser: 0.96 mg/dL (ref 0.61–1.24)
GFR calc Af Amer: >60 mL/min (ref >60)
GFR calc non Af Amer: >60 mL/min (ref >60)
Glucose, Bld: 150 mg/dL — ABNORMAL HIGH (ref 70–99)
Potassium: 4.1 mmol/L (ref 3.5–5.1)
Sodium: 140 mmol/L (ref 135–145)
Total Bilirubin: 0.9 mg/dL (ref 0.3–1.2)
Total Protein: 5.9 g/dL — ABNORMAL LOW (ref 6.5–8.1)

## 2019-09-17 LAB — URINE CULTURE: Culture: NO GROWTH

## 2019-09-17 LAB — GLUCOSE, CAPILLARY: Glucose-Capillary: 121 mg/dL — ABNORMAL HIGH (ref 70–99)

## 2019-09-17 MED ORDER — PREDNISONE 20 MG PO TABS: 40.0000 mg | ORAL_TABLET | Freq: Every day | ORAL | 0 refills | Status: AC

## 2019-09-17 NOTE — Progress Notes (Signed)
[facility] Gastroenterology Progress Note  [name] 54 y.o. 10/03/1965   Subjective: Feels good. Denies abdominal pain/N/V. Tolerating soft diet.  Objective: Vital signs: Vitals:   09/16/19 2102 09/17/19 0512  BP: 114/69 127/78  Pulse: 63 65  Resp: 18 18  Temp: 98.3 F (36.8 C) 97.8 F (36.6 C)  SpO2: 96% 97%    Physical Exam: Gen: alert, no acute distress, well-nourished, pleasant HEENT: anicteric sclera CV: RRR Chest: CTA B Abd: soft, nontender, nondistended, +BS Ext: no edema  Lab Results: Recent Labs    09/16/19 0323 09/17/19 0425  NA 138 140  K 4.3 4.1  CL 107 110  CO2 23 26  GLUCOSE 165* 150*  BUN 17 15  CREATININE 1.08 0.96  CALCIUM 8.3* 8.3*   Recent Labs    09/16/19 0323 09/17/19 0425  AST 16 14*  ALT 17 16  ALKPHOS 58 54  BILITOT 1.4* 0.9  PROT 6.0* 5.9*  ALBUMIN 3.6 3.4*   Recent Labs    09/15/19 1735 09/16/19 0323  WBC 18.1* 11.9*  HGB 16.1 14.6  HCT 47.5 43.7  MCV 90.5 93.2  PLT 315 265      Assessment/Plan: Crohn's flare with a partial small bowel obstruction - clinically resolved. Ok to go home today on Prednisone 40 mg/day. Advance diet at home to low fat diet until further notice. Our office will call to arrange f/u with me for 2-3 weeks. Will taper Prednisone at f/u. Continue Apriso outpt dose. D/W Dr. Jerral Ralph.   Shirley Friar 09/17/2019, 9:34 AM  Questions please call [phone] ID: Vernon Rollins, male   DOB: 08-Jun-1966, 54 y.o.   MRN: [id number]

## 2019-09-17 NOTE — Discharge Summary (Signed)
Physician Discharge Summary  Vernon Rollins TKW:[id number] DOB: 1965/12/15 DOA: 09/15/2019  PCP: Gweneth Dimitri, MD  Admit date: 09/15/2019 Discharge date: 09/17/2019  Admitted From: Home Disposition: Home  Recommendations for Outpatient Follow-up:  1. Follow up with PCP in 1-2 weeks Follow-up with gastroenterology as scheduled.   Discharge Condition: Stable CODE STATUS: Full code Diet recommendation: Regular soft diet  Discharge summary: Patient has Crohn's disease he is on mesalamine presented to ER with 1 day of abdominal pain and multiple episodes of vomiting.  CT scan showed partial small bowel obstruction, active ileitis.  Admit to the hospital treated with IV fluids and IV steroids.  Symptoms completely improved.  Tolerating soft diet. As per GI recommendation, he is going home on prednisone 40 mg daily to continue for now, GI will follow outpatient and start tapering.  No other changes in medication done.  Discharge Diagnoses:  Principal Problem:   SBO (small bowel obstruction) (HCC) Active Problems:   Allergic reaction to contrast dye   Essential hypertension    Discharge Instructions  Discharge Instructions    Call MD for:  persistant dizziness or light-headedness   Complete by: As directed    Call MD for:  severe uncontrolled pain   Complete by: As directed    Diet - low sodium heart healthy   Complete by: As directed    Increase activity slowly   Complete by: As directed      Allergies as of 09/17/2019      Reactions   Iohexol Swelling   Right orbital swelling post injection of IV contrast for CT scan of abd/pel.  Patient will need pre-med      Medication List    STOP taking these medications   ciprofloxacin 500 MG tablet Commonly known as: CIPRO   metroNIDAZOLE 500 MG tablet Commonly known as: FLAGYL   ondansetron 4 MG tablet Commonly known as: ZOFRAN   oxyCODONE-acetaminophen 5-325 MG tablet Commonly known as: PERCOCET/ROXICET     TAKE these  medications   albuterol 108 (90 Base) MCG/ACT inhaler Commonly known as: VENTOLIN HFA Inhale 2 puffs into the lungs every 6 (six) hours as needed for wheezing or shortness of breath.   escitalopram 20 MG tablet Commonly known as: LEXAPRO Take 20 mg by mouth every morning.   losartan 100 MG tablet Commonly known as: COZAAR Take 100 mg by mouth daily.   mesalamine 0.375 g 24 hr capsule Commonly known as: APRISO Take 0.75 g by mouth daily. Take 2 capsules (0.75 mg)   predniSONE 20 MG tablet Commonly known as: DELTASONE Take 2 tablets (40 mg total) by mouth daily.       Allergies  Allergen Reactions  . Iohexol Swelling    Right orbital swelling post injection of IV contrast for CT scan of abd/pel.  Patient will need pre-med    Consultations:  Gastroenterology   Procedures/Studies: DG Abd 1 View  Result Date: 09/16/2019 CLINICAL DATA:  Follow-up small bowel obstruction EXAM: ABDOMEN - 1 VIEW COMPARISON:  CT yesterday FINDINGS: Stable bowel gas pattern compared to scanogram with underestimated small bowel distention when compared with prior CT. There is underlying small bowel thickening distally in this patient with history of Crohn's. No concerning mass effect or gas collection. IMPRESSION: Stable compared to abdominal CT yesterday. There is partial small bowel obstruction related to active ileitis or fibrostenosis in this patient with history of Crohn's. Electronically Signed   By: Marnee Spring M.D.   On: 09/16/2019 07:49   CT ABDOMEN  PELVIS W CONTRAST  Result Date: 09/15/2019 CLINICAL DATA:  Abdominal pain with history of Crohn's disease. EXAM: CT ABDOMEN AND PELVIS WITH CONTRAST TECHNIQUE: Multidetector CT imaging of the abdomen and pelvis was performed using the standard protocol following bolus administration of intravenous contrast. CONTRAST:  120mL OMNIPAQUE IOHEXOL 300 MG/ML  SOLN COMPARISON:  November 14, 2013 FINDINGS: Lower chest: No acute abnormality. Hepatobiliary:  No focal liver abnormality is seen. No gallstones, gallbladder wall thickening, or biliary dilatation. Pancreas: Unremarkable. No pancreatic ductal dilatation or surrounding inflammatory changes. Spleen: Normal in size without focal abnormality. Adrenals/Urinary Tract: Adrenal glands are unremarkable. Kidneys are normal, without renal calculi, focal lesion, or hydronephrosis. Bladder is unremarkable. Stomach/Bowel: Stomach is within normal limits. The appendix is not clearly identified. Multiple dilated small bowel loops are seen within the mid and lower abdomen (maximum small bowel diameter of approximately 3.2 cm). An abrupt transition zone is seen within the anterior aspect of the right lower quadrant (axial CT images 48 through 53, CT series number 3). Mild thickening of the terminal ileum is noted. Vascular/Lymphatic: Mild aortic atherosclerosis. No enlarged abdominal or pelvic lymph nodes. Reproductive: Prostate is unremarkable. Other: A 3.9 cm x 2.6 cm right inguinal hernia is seen. An additional 3.3 cm x 2.5 cm left inguinal hernia is noted. Both hernias contain fat and fluid. A small amount of posterior pelvic free fluid is seen. Musculoskeletal: No acute or significant osseous findings. IMPRESSION: 1. Findings consistent with a distal partial small bowel obstruction. 2. Mild thickening of the terminal ileum. Mild inflammation involving this portion of distal ileum cannot be excluded. Electronically Signed   By: Virgina Norfolk M.D.   On: 09/15/2019 22:37    Subjective: Patient seen and examined.  Early morning today he had a soft diet on his breakfast.  He was eager to go home.  Denied any complaints.   Discharge Exam: Vitals:   09/16/19 2102 09/17/19 0512  BP: 114/69 127/78  Pulse: 63 65  Resp: 18 18  Temp: 98.3 F (36.8 C) 97.8 F (36.6 C)  SpO2: 96% 97%   Vitals:   09/16/19 0258 09/16/19 1333 09/16/19 2102 09/17/19 0512  BP: 118/74 110/70 114/69 127/78  Pulse: 66 65 63 65  Resp:  18 17 18 18   Temp: 98.1 F (36.7 C) 98.3 F (36.8 C) 98.3 F (36.8 C) 97.8 F (36.6 C)  TempSrc: Oral Oral    SpO2: 96% 96% 96% 97%  Weight:      Height:        General: Pt is alert, awake, not in acute distress Cardiovascular: RRR, S1/S2 +, no rubs, no gallops Respiratory: CTA bilaterally, no wheezing, no rhonchi Abdominal: Soft, NT, ND, bowel sounds + Extremities: no edema, no cyanosis    The results of significant diagnostics from this hospitalization (including imaging, microbiology, ancillary and laboratory) are listed below for reference.     Microbiology: Recent Results (from the past 240 hour(s))  Respiratory Panel by RT PCR (Flu A&B, Covid) - Nasopharyngeal Swab     Status: None   Collection Time: 09/15/19  9:45 PM   Specimen: Nasopharyngeal Swab  Result Value Ref Range Status   SARS Coronavirus 2 by RT PCR NEGATIVE NEGATIVE Final    Comment: (NOTE) SARS-CoV-2 target nucleic acids are NOT DETECTED. The SARS-CoV-2 RNA is generally detectable in upper respiratoy specimens during the acute phase of infection. The lowest concentration of SARS-CoV-2 viral copies this assay can detect is 131 copies/mL. A negative result does not preclude SARS-Cov-2  infection and should not be used as the sole basis for treatment or other patient management decisions. A negative result may occur with  improper specimen collection/handling, submission of specimen other than nasopharyngeal swab, presence of viral mutation(s) within the areas targeted by this assay, and inadequate number of viral copies (<131 copies/mL). A negative result must be combined with clinical observations, patient history, and epidemiological information. The expected result is Negative. Fact Sheet for Patients:  https://www.moore.com/https://www.fda.gov/media/142436/download Fact Sheet for Healthcare Providers:  https://www.young.biz/https://www.fda.gov/media/142435/download This test is not yet ap proved or cleared by the [place] FDA and  has  been authorized for detection and/or diagnosis of SARS-CoV-2 by FDA under an Emergency Use Authorization (EUA). This EUA will remain  in effect (meaning this test can be used) for the duration of the COVID-19 declaration under Section 564(b)(1) of the Act, 21 U.S.C. section 360bbb-3(b)(1), unless the authorization is terminated or revoked sooner.    Influenza A by PCR NEGATIVE NEGATIVE Final   Influenza B by PCR NEGATIVE NEGATIVE Final    Comment: (NOTE) The Xpert Xpress SARS-CoV-2/FLU/RSV assay is intended as an aid in  the diagnosis of influenza from Nasopharyngeal swab specimens and  should not be used as a sole basis for treatment. Nasal washings and  aspirates are unacceptable for Xpert Xpress SARS-CoV-2/FLU/RSV  testing. Fact Sheet for Patients: https://www.moore.com/https://www.fda.gov/media/142436/download Fact Sheet for Healthcare Providers: https://www.young.biz/https://www.fda.gov/media/142435/download This test is not yet approved or cleared by the [place] FDA and  has been authorized for detection and/or diagnosis of SARS-CoV-2 by  FDA under an Emergency Use Authorization (EUA). This EUA will remain  in effect (meaning this test can be used) for the duration of the  Covid-19 declaration under Section 564(b)(1) of the Act, 21  U.S.C. section 360bbb-3(b)(1), unless the authorization is  terminated or revoked. Performed at [facility], [address] [place], [place] [address]   Urine culture     Status: None   Collection Time: 09/16/19  7:44 AM   Specimen: Urine, Clean Catch  Result Value Ref Range Status   Specimen Description   Final    URINE, CLEAN CATCH Performed at [facility], [address] [place], [place] [address]    Special Requests   Final    NONE Performed at [facility], [address] [place], [place] [address]    Culture   Final    NO GROWTH Performed at [facility], [address] [place], [place] [address]     Report Status 09/17/2019 FINAL  Final     Labs: BNP (last 3 results) No results for input(s): BNP in the last 8760 hours. Basic Metabolic Panel: Recent Labs  Lab 09/15/19 1735 09/16/19 0323 09/17/19 0425  NA 138 138 140  K 4.3 4.3 4.1  CL 104 107 110  CO2 24 23 26   GLUCOSE 113* 165* 150*  BUN 16 17 15   CREATININE 1.07 1.08 0.96  CALCIUM 9.8 8.3* 8.3*   Liver Function Tests: Recent Labs  Lab 09/15/19 1735 09/16/19 0323 09/17/19 0425  AST 23 16 14*  ALT 22 17 16   ALKPHOS 66 58 54  BILITOT 1.4* 1.4* 0.9  PROT 7.4 6.0* 5.9*  ALBUMIN 4.4 3.6 3.4*   Recent Labs  Lab 09/15/19 1735  LIPASE 25   No results for input(s): AMMONIA in the last 168 hours. CBC: Recent Labs  Lab 09/15/19 1735 09/16/19 0323  WBC 18.1* 11.9*  HGB 16.1 14.6  HCT 47.5 43.7  MCV 90.5 93.2  PLT 315 265   Cardiac Enzymes: No results for input(s): CKTOTAL, CKMB, CKMBINDEX, TROPONINI in the last 168 hours. BNP: Invalid input(s): POCBNP CBG: Recent Labs  Lab 09/16/19 0742 09/16/19 1604 09/17/19 0739  GLUCAP 146* 122* 121*   D-Dimer No results for input(s): DDIMER in the last 72 hours. Hgb A1c No results for input(s): HGBA1C in the last 72 hours. Lipid Profile No results for input(s): CHOL, HDL, LDLCALC, TRIG, CHOLHDL, LDLDIRECT in the last 72 hours. Thyroid function studies No results for input(s): TSH, T4TOTAL, T3FREE, THYROIDAB in the last 72 hours.  Invalid input(s): FREET3 Anemia work up No results for input(s): VITAMINB12, FOLATE, FERRITIN, TIBC, IRON, RETICCTPCT in the last 72 hours. Urinalysis    Component Value Date/Time   COLORURINE YELLOW 09/16/2019 0744   APPEARANCEUR CLEAR 09/16/2019 0744   LABSPEC 1.026 09/16/2019 0744   PHURINE 5.0 09/16/2019 0744   GLUCOSEU NEGATIVE 09/16/2019 0744   HGBUR NEGATIVE 09/16/2019 0744   BILIRUBINUR NEGATIVE 09/16/2019 0744   KETONESUR 5 (A) 09/16/2019 0744   PROTEINUR NEGATIVE 09/16/2019 0744   UROBILINOGEN 1.0 11/14/2013 1752    NITRITE NEGATIVE 09/16/2019 0744   LEUKOCYTESUR NEGATIVE 09/16/2019 0744   Sepsis Labs Invalid input(s): PROCALCITONIN,  WBC,  LACTICIDVEN Microbiology Recent Results (from the past 240 hour(s))  Respiratory Panel by RT PCR (Flu A&B, Covid) - Nasopharyngeal Swab     Status: None   Collection Time: 09/15/19  9:45 PM   Specimen: Nasopharyngeal Swab  Result Value Ref Range Status   SARS Coronavirus 2 by RT PCR NEGATIVE NEGATIVE Final    Comment: (NOTE) SARS-CoV-2 target nucleic acids are NOT DETECTED. The SARS-CoV-2 RNA is generally detectable in upper respiratoy specimens during the acute phase of infection. The lowest concentration of SARS-CoV-2 viral copies this assay can detect is 131 copies/mL. A negative result does not preclude SARS-Cov-2 infection and should not be used as the sole basis for treatment or other patient management decisions. A negative result may occur with  improper specimen collection/handling, submission of specimen other than nasopharyngeal swab, presence of viral mutation(s) within the areas targeted by this assay, and inadequate number of viral copies (<131 copies/mL). A negative result must be combined with clinical observations, patient history, and epidemiological information. The expected result is Negative. Fact Sheet for Patients:  https://www.moore.com/ Fact Sheet for Healthcare Providers:  https://www.young.biz/ This test is not yet ap proved or cleared by the [place] FDA and  has been authorized for detection and/or diagnosis of SARS-CoV-2 by FDA under an Emergency Use Authorization (EUA). This EUA will remain  in effect (meaning this test can be used) for the duration of the COVID-19 declaration under Section 564(b)(1) of the Act, 21 U.S.C. section 360bbb-3(b)(1), unless the authorization is terminated or revoked sooner.    Influenza A by PCR NEGATIVE NEGATIVE Final   Influenza B by PCR NEGATIVE  NEGATIVE Final    Comment: (NOTE) The Xpert Xpress SARS-CoV-2/FLU/RSV assay is intended as an aid in  the diagnosis of influenza from Nasopharyngeal swab specimens and  should not be used as a sole basis for treatment. Nasal washings and  aspirates are unacceptable for Xpert Xpress SARS-CoV-2/FLU/RSV  testing. Fact Sheet for Patients: https://www.moore.com/ Fact Sheet for Healthcare Providers: https://www.young.biz/ This test is not yet approved or cleared by the [place] FDA and  has been authorized for detection and/or diagnosis of SARS-CoV-2 by  FDA under an Emergency Use Authorization (EUA). This EUA will remain  in effect (meaning this  test can be used) for the duration of the  Covid-19 declaration under Section 564(b)(1) of the Act, 21  U.S.C. section 360bbb-3(b)(1), unless the authorization is  terminated or revoked. Performed at [facility], [address] [place], [place] [address]   Urine culture     Status: None   Collection Time: 09/16/19  7:44 AM   Specimen: Urine, Clean Catch  Result Value Ref Range Status   Specimen Description   Final    URINE, CLEAN CATCH Performed at [facility], [address] [place], [place] [address]    Special Requests   Final    NONE Performed at [facility], [address] [place], [place] [address]    Culture   Final    NO GROWTH Performed at [facility], [address] [place], [place] [address]    Report Status 09/17/2019 FINAL  Final     Time coordinating discharge:  25 minutes  SIGNED:   Dorcas Carrow, MD  [facility] 09/17/2019, 9:36 AM

## 2019-09-17 NOTE — [place] Notes (Signed)
Pt states no active vomiting since Tuesday. Pt denies nausea, vomiting or abdominal pain this morning and states that he feels OK to start a soft diet. Soft diet order placed per standing order. Will continue to monitor patient.

## 2019-09-30 DIAGNOSIS — W108XXA Fall (on) (from) other stairs and steps, initial encounter: Secondary | ICD-10-CM | POA: Diagnosis not present

## 2019-09-30 DIAGNOSIS — S2241XA Multiple fractures of ribs, right side, initial encounter for closed fracture: Secondary | ICD-10-CM | POA: Diagnosis not present

## 2019-10-16 DIAGNOSIS — K50012 Crohn's disease of small intestine with intestinal obstruction: Secondary | ICD-10-CM | POA: Diagnosis not present

## 2020-03-11 DIAGNOSIS — F411 Generalized anxiety disorder: Secondary | ICD-10-CM | POA: Diagnosis not present

## 2020-03-11 DIAGNOSIS — I10 Essential (primary) hypertension: Secondary | ICD-10-CM | POA: Diagnosis not present

## 2020-03-11 DIAGNOSIS — J309 Allergic rhinitis, unspecified: Secondary | ICD-10-CM | POA: Diagnosis not present

## 2020-03-11 DIAGNOSIS — Z125 Encounter for screening for malignant neoplasm of prostate: Secondary | ICD-10-CM | POA: Diagnosis not present

## 2020-03-11 DIAGNOSIS — E782 Mixed hyperlipidemia: Secondary | ICD-10-CM | POA: Diagnosis not present

## 2020-06-22 DIAGNOSIS — K50012 Crohn's disease of small intestine with intestinal obstruction: Secondary | ICD-10-CM | POA: Diagnosis not present

## 2020-10-08 IMAGING — CT CT ABD-PELV W/ CM
2 of 5 series · 16 of 46 positions shown, 18 images · IV contrast (OMNIPAQUE 300)
Comparison: November 14, 2013

CLINICAL DATA: Abdominal pain with history of Crohn's disease.

EXAM:
CT ABDOMEN AND PELVIS WITH CONTRAST
TECHNIQUE: Multidetector CT imaging of the abdomen and pelvis was performed
using the standard protocol following bolus administration of
intravenous contrast.
CONTRAST:  100mL OMNIPAQUE IOHEXOL 300 MG/ML  SOLN

[Series 3: axial st · axial · 0.68mm/px · z∈[+1084,+1479]mm · 13 of 91 slices shown, 15 images]
[im 6/91  soft-tissue]
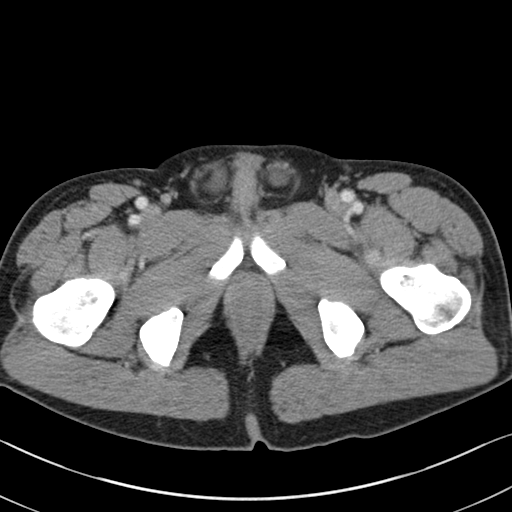
[im 6/91  bone]
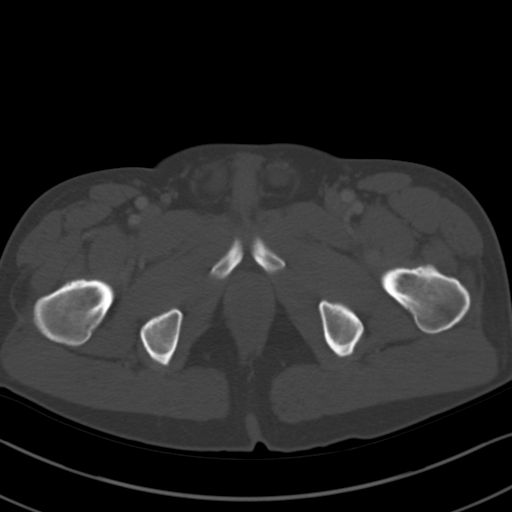
[im 12/91  soft-tissue]
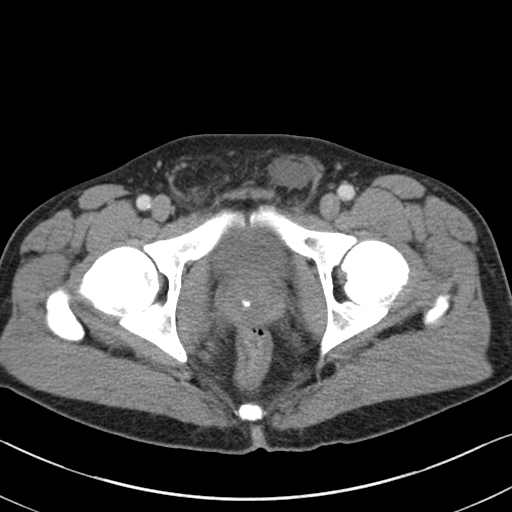
[im 17/91  soft-tissue]
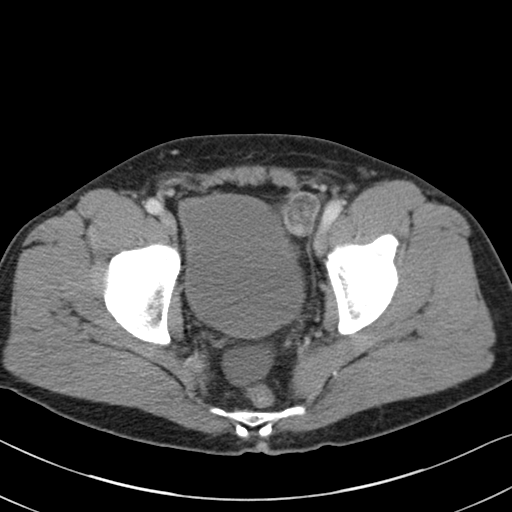
[im 29/91  soft-tissue]
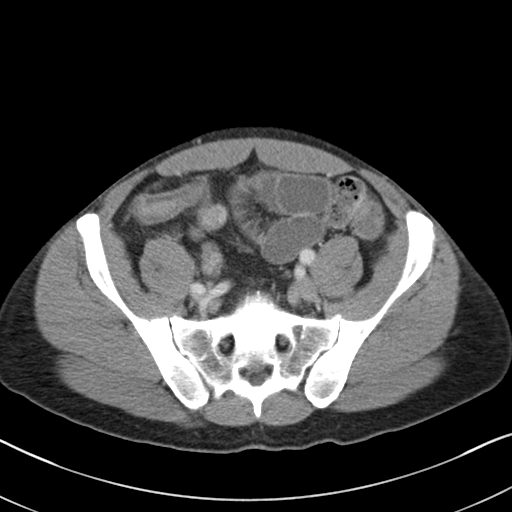
[im 34/91  soft-tissue]
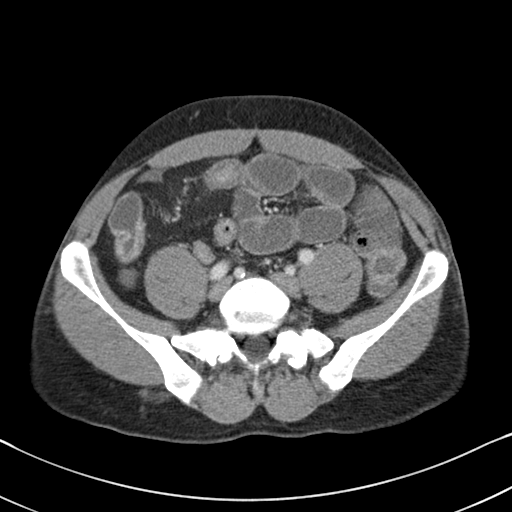
[im 40/91  soft-tissue]
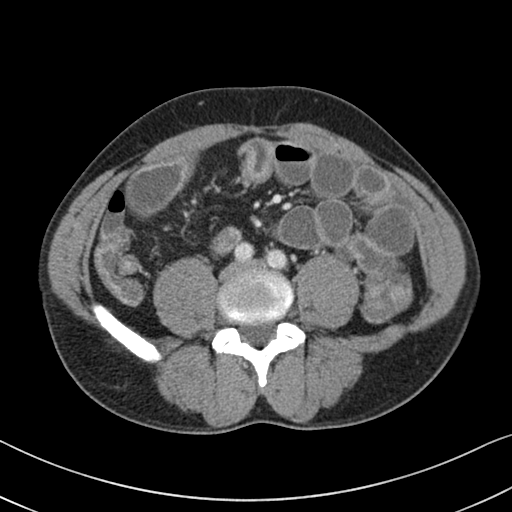
[im 46/91  soft-tissue]
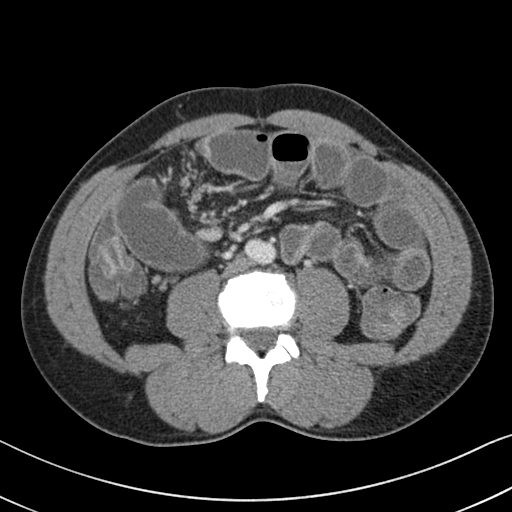
[im 51/91  soft-tissue]
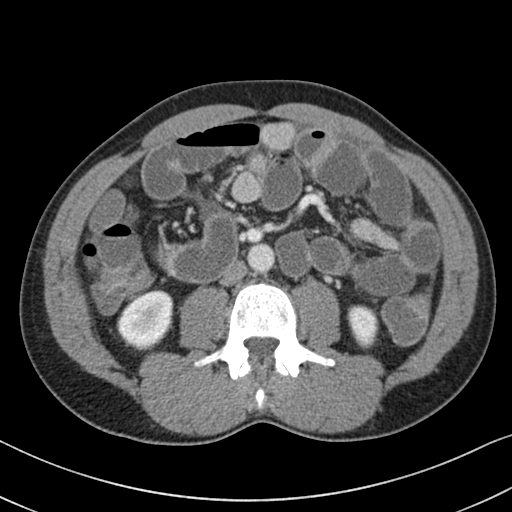
[im 57/91  soft-tissue]
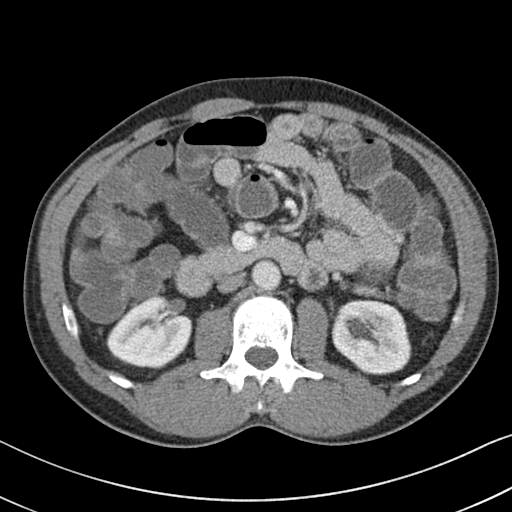
[im 57/91  bone]
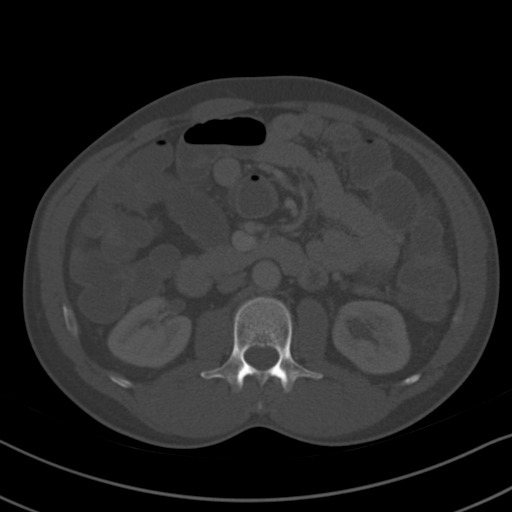
[im 62/91  soft-tissue]
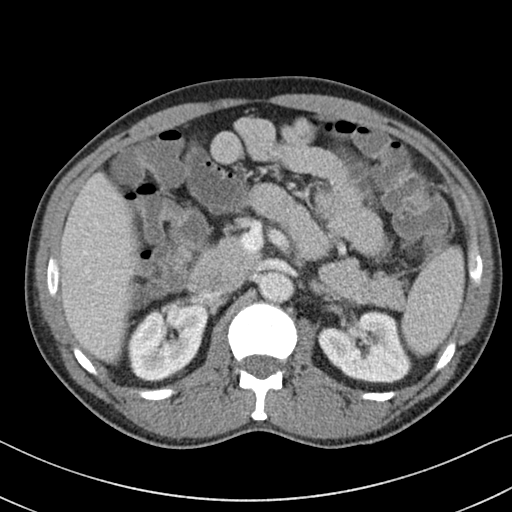
[im 74/91  soft-tissue]
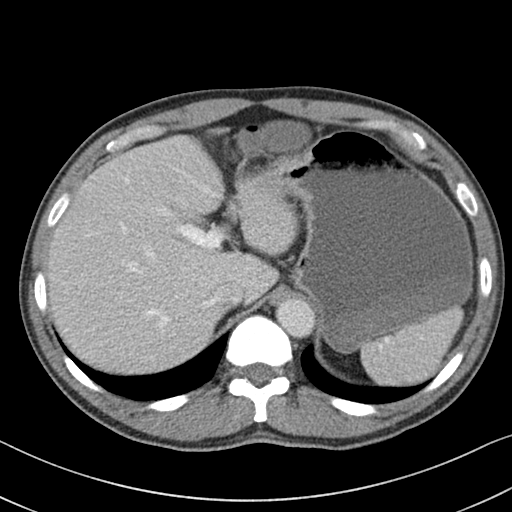
[im 79/91  soft-tissue]
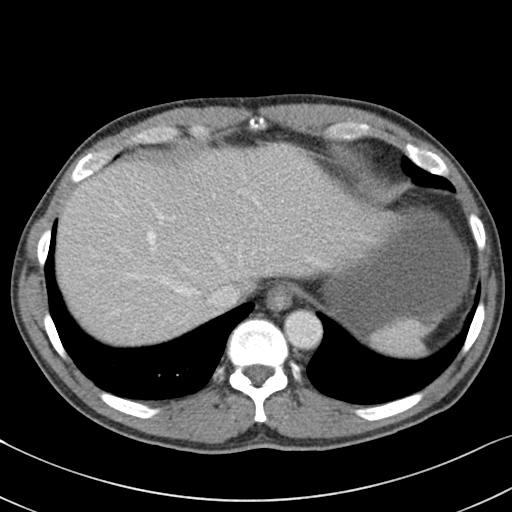
[im 85/91  soft-tissue]
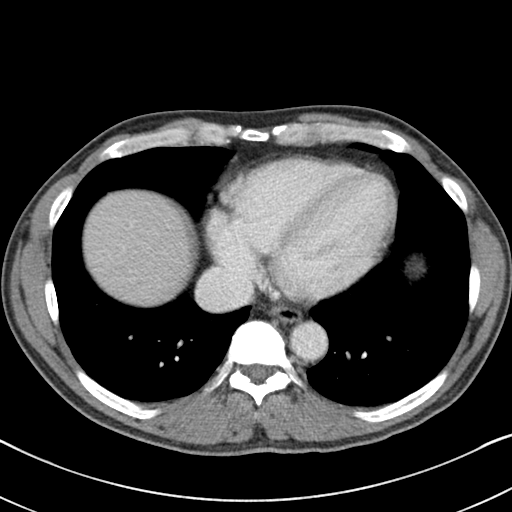

[Series 6: coronal st · coronal · 0.68mm/px · 3 of 124 slices shown]
[im 42/124  soft-tissue]
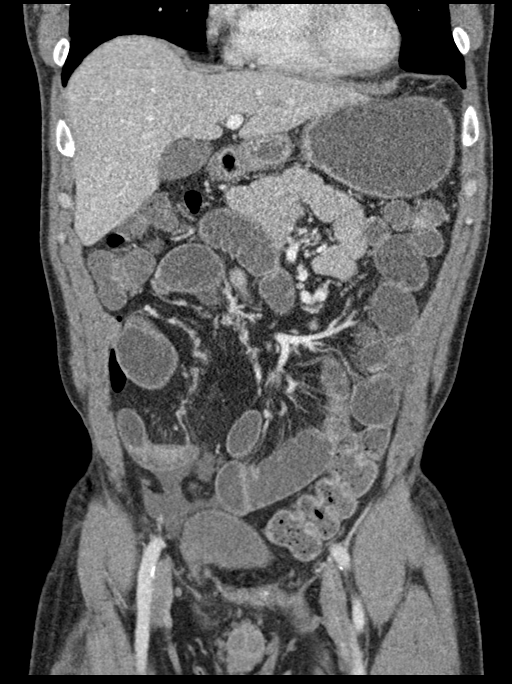
[im 55/124  soft-tissue]
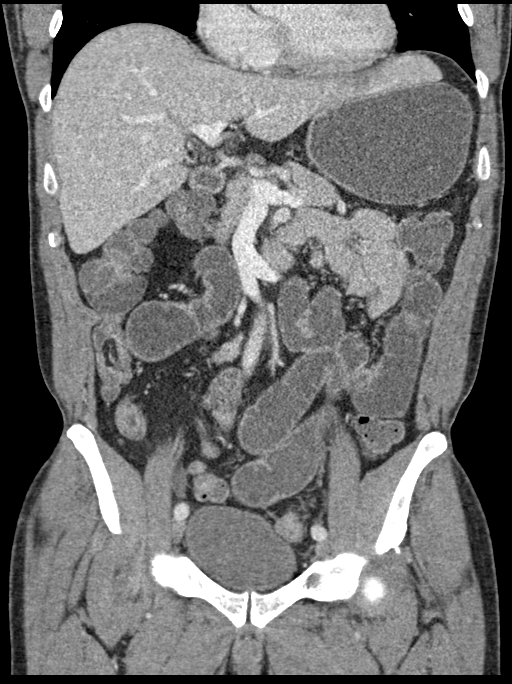
[im 69/124  soft-tissue]
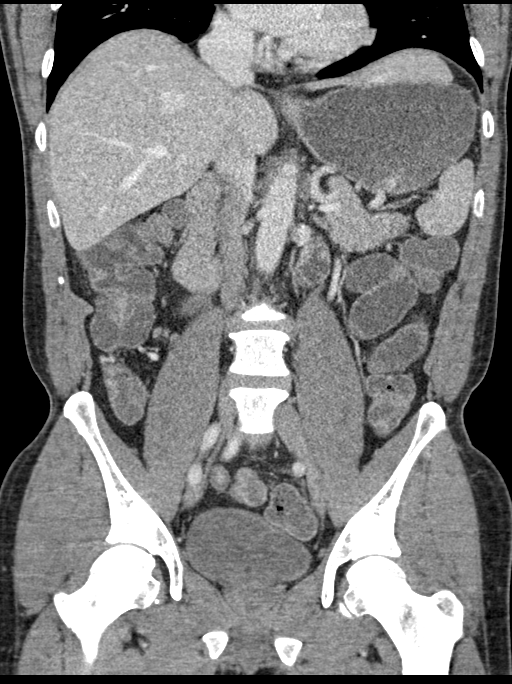

[16 of 46 positions shown; findings below may reference images not displayed]

FINDINGS: Lower chest: No acute abnormality.

Hepatobiliary: No focal liver abnormality is seen. No gallstones,
gallbladder wall thickening, or biliary dilatation.

Pancreas: Unremarkable. No pancreatic ductal dilatation or
surrounding inflammatory changes.

Spleen: Normal in size without focal abnormality.

Adrenals/Urinary Tract: Adrenal glands are unremarkable. Kidneys are
normal, without renal calculi, focal lesion, or hydronephrosis.
Bladder is unremarkable.

Stomach/Bowel: Stomach is within normal limits. The appendix is not
clearly identified. Multiple dilated small bowel loops are seen
within the mid and lower abdomen (maximum small bowel diameter of
approximately 3.2 cm). An abrupt transition zone is seen within the
anterior aspect of the right lower quadrant (axial CT images 48
through 53, CT series number 3). Mild thickening of the terminal
ileum is noted.

Vascular/Lymphatic: Mild aortic atherosclerosis. No enlarged
abdominal or pelvic lymph nodes.

Reproductive: Prostate is unremarkable.

Other: A 3.9 cm x 2.6 cm right inguinal hernia is seen. An
additional 3.3 cm x 2.5 cm left inguinal hernia is noted. Both
hernias contain fat and fluid.

A small amount of posterior pelvic free fluid is seen.

Musculoskeletal: No acute or significant osseous findings.
IMPRESSION: 1. Findings consistent with a distal partial small bowel
obstruction.
2. Mild thickening of the terminal ileum. Mild inflammation
involving this portion of distal ileum cannot be excluded.

## 2021-01-04 DIAGNOSIS — K50012 Crohn's disease of small intestine with intestinal obstruction: Secondary | ICD-10-CM | POA: Diagnosis not present

## 2021-04-05 DIAGNOSIS — L578 Other skin changes due to chronic exposure to nonionizing radiation: Secondary | ICD-10-CM | POA: Diagnosis not present

## 2021-04-05 DIAGNOSIS — L814 Other melanin hyperpigmentation: Secondary | ICD-10-CM | POA: Diagnosis not present

## 2021-04-05 DIAGNOSIS — L570 Actinic keratosis: Secondary | ICD-10-CM | POA: Diagnosis not present

## 2021-05-15 DIAGNOSIS — Z125 Encounter for screening for malignant neoplasm of prostate: Secondary | ICD-10-CM | POA: Diagnosis not present

## 2021-05-15 DIAGNOSIS — E782 Mixed hyperlipidemia: Secondary | ICD-10-CM | POA: Diagnosis not present

## 2021-05-15 DIAGNOSIS — I10 Essential (primary) hypertension: Secondary | ICD-10-CM | POA: Diagnosis not present

## 2021-05-15 DIAGNOSIS — N401 Enlarged prostate with lower urinary tract symptoms: Secondary | ICD-10-CM | POA: Diagnosis not present

## 2021-05-15 DIAGNOSIS — Z23 Encounter for immunization: Secondary | ICD-10-CM | POA: Diagnosis not present

## 2021-05-15 DIAGNOSIS — F411 Generalized anxiety disorder: Secondary | ICD-10-CM | POA: Diagnosis not present

## 2021-05-15 DIAGNOSIS — R351 Nocturia: Secondary | ICD-10-CM | POA: Diagnosis not present

## 2021-08-24 DIAGNOSIS — E782 Mixed hyperlipidemia: Secondary | ICD-10-CM | POA: Diagnosis not present

## 2021-08-24 DIAGNOSIS — J45990 Exercise induced bronchospasm: Secondary | ICD-10-CM | POA: Diagnosis not present

## 2021-08-24 DIAGNOSIS — I10 Essential (primary) hypertension: Secondary | ICD-10-CM | POA: Diagnosis not present

## 2021-08-24 DIAGNOSIS — F411 Generalized anxiety disorder: Secondary | ICD-10-CM | POA: Diagnosis not present

## 2022-04-03 DIAGNOSIS — K5080 Crohn's disease of both small and large intestine without complications: Secondary | ICD-10-CM | POA: Diagnosis not present

## 2022-06-13 DIAGNOSIS — L03113 Cellulitis of right upper limb: Secondary | ICD-10-CM | POA: Diagnosis not present

## 2022-06-13 DIAGNOSIS — L089 Local infection of the skin and subcutaneous tissue, unspecified: Secondary | ICD-10-CM | POA: Diagnosis not present

## 2022-07-10 DIAGNOSIS — L02511 Cutaneous abscess of right hand: Secondary | ICD-10-CM | POA: Diagnosis not present

## 2022-07-27 DIAGNOSIS — K0401 Reversible pulpitis: Secondary | ICD-10-CM | POA: Diagnosis not present

## 2022-08-06 DIAGNOSIS — Z125 Encounter for screening for malignant neoplasm of prostate: Secondary | ICD-10-CM | POA: Diagnosis not present

## 2022-08-06 DIAGNOSIS — E782 Mixed hyperlipidemia: Secondary | ICD-10-CM | POA: Diagnosis not present

## 2022-08-06 DIAGNOSIS — F411 Generalized anxiety disorder: Secondary | ICD-10-CM | POA: Diagnosis not present

## 2022-08-06 DIAGNOSIS — Z1159 Encounter for screening for other viral diseases: Secondary | ICD-10-CM | POA: Diagnosis not present

## 2022-08-06 DIAGNOSIS — Z23 Encounter for immunization: Secondary | ICD-10-CM | POA: Diagnosis not present

## 2022-08-06 DIAGNOSIS — I10 Essential (primary) hypertension: Secondary | ICD-10-CM | POA: Diagnosis not present

## 2022-08-06 DIAGNOSIS — K5080 Crohn's disease of both small and large intestine without complications: Secondary | ICD-10-CM | POA: Diagnosis not present

## 2022-08-06 DIAGNOSIS — J45990 Exercise induced bronchospasm: Secondary | ICD-10-CM | POA: Diagnosis not present

## 2022-08-10 DIAGNOSIS — L089 Local infection of the skin and subcutaneous tissue, unspecified: Secondary | ICD-10-CM | POA: Diagnosis not present

## 2022-08-17 DIAGNOSIS — L089 Local infection of the skin and subcutaneous tissue, unspecified: Secondary | ICD-10-CM | POA: Diagnosis not present

## 2022-09-19 DIAGNOSIS — L089 Local infection of the skin and subcutaneous tissue, unspecified: Secondary | ICD-10-CM | POA: Diagnosis not present

## 2022-10-01 DIAGNOSIS — L089 Local infection of the skin and subcutaneous tissue, unspecified: Secondary | ICD-10-CM | POA: Diagnosis not present

## 2023-01-12 DIAGNOSIS — J0100 Acute maxillary sinusitis, unspecified: Secondary | ICD-10-CM | POA: Diagnosis not present

## 2023-02-18 ENCOUNTER — Emergency Department (HOSPITAL_COMMUNITY): Payer: BC Managed Care – PPO

## 2023-02-18 ENCOUNTER — Encounter (HOSPITAL_COMMUNITY): Payer: Self-pay | Admitting: Emergency Medicine

## 2023-02-18 ENCOUNTER — Inpatient Hospital Stay (HOSPITAL_COMMUNITY)
Admission: EM | Admit: 2023-02-18 | Discharge: 2023-02-20 | DRG: 386 | Disposition: A | Discharge status: Home / Self Care | Payer: BC Managed Care – PPO | Attending: Family Medicine | Admitting: Family Medicine

## 2023-02-18 ENCOUNTER — Other Ambulatory Visit: Payer: Self-pay

## 2023-02-18 DIAGNOSIS — K5090 Crohn's disease, unspecified, without complications: Secondary | ICD-10-CM | POA: Diagnosis not present

## 2023-02-18 DIAGNOSIS — R1031 Right lower quadrant pain: Secondary | ICD-10-CM

## 2023-02-18 DIAGNOSIS — K56609 Unspecified intestinal obstruction, unspecified as to partial versus complete obstruction: Secondary | ICD-10-CM | POA: Diagnosis not present

## 2023-02-18 DIAGNOSIS — Z79899 Other long term (current) drug therapy: Secondary | ICD-10-CM | POA: Diagnosis not present

## 2023-02-18 DIAGNOSIS — Z91041 Radiographic dye allergy status: Secondary | ICD-10-CM | POA: Diagnosis not present

## 2023-02-18 DIAGNOSIS — K50119 Crohn's disease of large intestine with unspecified complications: Secondary | ICD-10-CM

## 2023-02-18 DIAGNOSIS — F32A Depression, unspecified: Secondary | ICD-10-CM | POA: Diagnosis not present

## 2023-02-18 DIAGNOSIS — K529 Noninfective gastroenteritis and colitis, unspecified: Secondary | ICD-10-CM | POA: Diagnosis not present

## 2023-02-18 DIAGNOSIS — N3289 Other specified disorders of bladder: Secondary | ICD-10-CM | POA: Diagnosis not present

## 2023-02-18 DIAGNOSIS — I10 Essential (primary) hypertension: Secondary | ICD-10-CM | POA: Diagnosis present

## 2023-02-18 DIAGNOSIS — K50012 Crohn's disease of small intestine with intestinal obstruction: Secondary | ICD-10-CM | POA: Diagnosis not present

## 2023-02-18 DIAGNOSIS — D72829 Elevated white blood cell count, unspecified: Secondary | ICD-10-CM | POA: Diagnosis present

## 2023-02-18 DIAGNOSIS — R109 Unspecified abdominal pain: Secondary | ICD-10-CM | POA: Diagnosis present

## 2023-02-18 DIAGNOSIS — N179 Acute kidney failure, unspecified: Secondary | ICD-10-CM | POA: Diagnosis present

## 2023-02-18 DIAGNOSIS — K50812 Crohn's disease of both small and large intestine with intestinal obstruction: Principal | ICD-10-CM | POA: Diagnosis present

## 2023-02-18 DIAGNOSIS — K50912 Crohn's disease, unspecified, with intestinal obstruction: Secondary | ICD-10-CM | POA: Diagnosis not present

## 2023-02-18 DIAGNOSIS — R933 Abnormal findings on diagnostic imaging of other parts of digestive tract: Secondary | ICD-10-CM | POA: Diagnosis not present

## 2023-02-18 LAB — COMPREHENSIVE METABOLIC PANEL
ALT: 21 U/L (ref 0–44)
ALT: 21 U/L (ref 0–44)
AST: 20 U/L (ref 15–41)
AST: 18 U/L (ref 15–41)
Albumin: 3.8 g/dL (ref 3.5–5.0)
Albumin: 3.8 g/dL (ref 3.5–5.0)
Alkaline Phosphatase: 57 U/L (ref 38–126)
Alkaline Phosphatase: 60 U/L (ref 38–126)
Anion gap: 8 (ref 5–15)
Anion gap: 14 (ref 5–15)
BUN: 22 mg/dL — ABNORMAL HIGH (ref 6–20)
BUN: 24 mg/dL — ABNORMAL HIGH (ref 6–20)
CO2: 22 mmol/L (ref 22–32)
CO2: 23 mmol/L (ref 22–32)
Calcium: 9.1 mg/dL (ref 8.9–10.3)
Calcium: 9.5 mg/dL (ref 8.9–10.3)
Chloride: 107 mmol/L (ref 98–111)
Chloride: 104 mmol/L (ref 98–111)
Creatinine, Ser: 1.22 mg/dL (ref 0.61–1.24)
Creatinine, Ser: 1.21 mg/dL (ref 0.61–1.24)
GFR, Estimated: >60 mL/min (ref >60)
GFR, Estimated: >60 mL/min (ref >60)
Glucose, Bld: 124 mg/dL — ABNORMAL HIGH (ref 70–99)
Glucose, Bld: 151 mg/dL — ABNORMAL HIGH (ref 70–99)
Potassium: 4 mmol/L (ref 3.5–5.1)
Potassium: 4.4 mmol/L (ref 3.5–5.1)
Sodium: 137 mmol/L (ref 135–145)
Sodium: 141 mmol/L (ref 135–145)
Total Bilirubin: 0.5 mg/dL (ref 0.3–1.2)
Total Bilirubin: 0.7 mg/dL (ref 0.3–1.2)
Total Protein: 6.6 g/dL (ref 6.5–8.1)
Total Protein: 6.6 g/dL (ref 6.5–8.1)

## 2023-02-18 LAB — URINALYSIS, ROUTINE W REFLEX MICROSCOPIC
Bilirubin Urine: NEGATIVE
Glucose, UA: NEGATIVE mg/dL
Hgb urine dipstick: NEGATIVE
Ketones, ur: NEGATIVE mg/dL
Leukocytes,Ua: NEGATIVE
Nitrite: NEGATIVE
Protein, ur: NEGATIVE mg/dL
Specific Gravity, Urine: 1.029 (ref 1.005–1.030)
pH: 5 (ref 5.0–8.0)

## 2023-02-18 LAB — CBC WITH DIFFERENTIAL/PLATELET
Abs Immature Granulocytes: 0.04 10*3/uL (ref 0.00–0.07)
Abs Immature Granulocytes: 0.05 10*3/uL (ref 0.00–0.07)
Basophils Absolute: 0 10*3/uL (ref 0.0–0.1)
Basophils Absolute: 0 10*3/uL (ref 0.0–0.1)
Basophils Relative: 0 %
Basophils Relative: 0 %
Eosinophils Absolute: 0.1 10*3/uL (ref 0.0–0.5)
Eosinophils Absolute: 0 10*3/uL (ref 0.0–0.5)
Eosinophils Relative: 1 %
Eosinophils Relative: 0 %
HCT: 44.3 % (ref 39.0–52.0)
HCT: 43.5 % (ref 39.0–52.0)
Hemoglobin: 14.8 g/dL (ref 13.0–17.0)
Hemoglobin: 14.8 g/dL (ref 13.0–17.0)
Immature Granulocytes: 0 %
Immature Granulocytes: 0 %
Lymphocytes Relative: 11 %
Lymphocytes Relative: 5 %
Lymphs Abs: 1.3 10*3/uL (ref 0.7–4.0)
Lymphs Abs: 0.8 10*3/uL (ref 0.7–4.0)
MCH: 30.4 pg (ref 26.0–34.0)
MCH: 30.9 pg (ref 26.0–34.0)
MCHC: 33.4 g/dL (ref 30.0–36.0)
MCHC: 34 g/dL (ref 30.0–36.0)
MCV: 91 fL (ref 80.0–100.0)
MCV: 90.8 fL (ref 80.0–100.0)
Monocytes Absolute: 0.6 10*3/uL (ref 0.1–1.0)
Monocytes Absolute: 0.2 10*3/uL (ref 0.1–1.0)
Monocytes Relative: 4 %
Monocytes Relative: 2 %
Neutro Abs: 10.6 10*3/uL — ABNORMAL HIGH (ref 1.7–7.7)
Neutro Abs: 13.6 10*3/uL — ABNORMAL HIGH (ref 1.7–7.7)
Neutrophils Relative %: 84 %
Neutrophils Relative %: 93 %
Platelets: 255 10*3/uL (ref 150–400)
Platelets: 250 10*3/uL (ref 150–400)
RBC: 4.87 MIL/uL (ref 4.22–5.81)
RBC: 4.79 MIL/uL (ref 4.22–5.81)
RDW: 12.5 % (ref 11.5–15.5)
RDW: 12.4 % (ref 11.5–15.5)
WBC: 12.7 10*3/uL — ABNORMAL HIGH (ref 4.0–10.5)
WBC: 14.7 10*3/uL — ABNORMAL HIGH (ref 4.0–10.5)
nRBC: 0 % (ref 0.0–0.2)
nRBC: 0 % (ref 0.0–0.2)

## 2023-02-18 LAB — MAGNESIUM
Magnesium: 2 mg/dL (ref 1.7–2.4)
Magnesium: 2.1 mg/dL (ref 1.7–2.4)

## 2023-02-18 MED ORDER — SODIUM CHLORIDE 0.9 % IV SOLN: INTRAVENOUS | Status: DC

## 2023-02-18 MED ORDER — ACETAMINOPHEN 325 MG PO TABS
650.0000 mg | ORAL_TABLET | Freq: Four times a day (QID) | ORAL | Status: DC | PRN
  Administered 2023-02-18: 650 mg via ORAL
  Filled 2023-02-18: qty 2

## 2023-02-18 MED ORDER — ACETAMINOPHEN 650 MG RE SUPP: 650.0000 mg | Freq: Four times a day (QID) | RECTAL | Status: DC | PRN

## 2023-02-18 MED ORDER — METHYLPREDNISOLONE SODIUM SUCC 125 MG IJ SOLR
60.0000 mg | Freq: Every day | INTRAMUSCULAR | Status: DC
  Administered 2023-02-18 – 2023-02-19 (×2): 60 mg via INTRAVENOUS
  Filled 2023-02-18 (×2): qty 2

## 2023-02-18 MED ORDER — FENTANYL CITRATE PF 50 MCG/ML IJ SOSY
50.0000 ug | PREFILLED_SYRINGE | Freq: Once | INTRAMUSCULAR | Status: AC
  Administered 2023-02-18: 50 ug via INTRAVENOUS
  Filled 2023-02-18: qty 1

## 2023-02-18 MED ORDER — ONDANSETRON HCL 4 MG/2ML IJ SOLN: 4.0000 mg | Freq: Four times a day (QID) | INTRAMUSCULAR | Status: DC | PRN

## 2023-02-18 MED ORDER — METHYLPREDNISOLONE SODIUM SUCC 125 MG IJ SOLR
125.0000 mg | INTRAMUSCULAR | Status: AC
  Administered 2023-02-18: 125 mg via INTRAVENOUS
  Filled 2023-02-18: qty 2

## 2023-02-18 MED ORDER — HYDROMORPHONE HCL 1 MG/ML IJ SOLN: 0.5000 mg | INTRAMUSCULAR | Status: DC | PRN

## 2023-02-18 MED ORDER — ONDANSETRON HCL 4 MG/2ML IJ SOLN
4.0000 mg | Freq: Once | INTRAMUSCULAR | Status: AC
  Administered 2023-02-18: 4 mg via INTRAVENOUS
  Filled 2023-02-18: qty 2

## 2023-02-18 MED ORDER — NALOXONE HCL 0.4 MG/ML IJ SOLN: 0.4000 mg | INTRAMUSCULAR | Status: DC | PRN

## 2023-02-18 NOTE — ED Provider Notes (Signed)
EMERGENCY DEPARTMENT AT [facility] Provider Note   CSN: [id number] Arrival date & time: 02/18/23  8295     History  No chief complaint on file.   Vernon Rollins. is a 57 y.o. male.  The history is provided by the patient and the spouse.  Abdominal Pain Pain location:  RLQ Pain quality: aching   Pain radiates to:  Does not radiate Pain severity:  Severe Onset quality:  Sudden Duration:  12 hours Timing:  Constant Progression:  Unchanged Chronicity:  Recurrent Context: not sick contacts   Context comment:  Ate a container of blueberries Relieved by:  Nothing Worsened by:  Nothing Ineffective treatments:  None tried Associated symptoms: nausea   Associated symptoms: no chest pain, no constipation, no cough, no diarrhea, no dysuria, no melena and no vomiting   Risk factors comment:  Crohn's disease Patient with Crohn's Disease who presents with RLQ pain and associated nausea.  Ate a container of blueberries.  No known triggers.  No rectal bleeding.  No fevers.  Patient takes 4 pills a day of [employer].  He sees Dr. Bosie Clos at [facility].      Past Medical History:  Diagnosis Date   Crohn disease (HCC)    HTN (hypertension)      Home Medications Prior to Admission medications   Medication Sig Start Date End Date Taking? Authorizing Provider  albuterol (PROVENTIL HFA;VENTOLIN HFA) 108 (90 BASE) MCG/ACT inhaler Inhale 2 puffs into the lungs every 6 (six) hours as needed for wheezing or shortness of breath.    [provider]  escitalopram (LEXAPRO) 20 MG tablet Take 20 mg by mouth every morning.    [provider]  losartan (COZAAR) 100 MG tablet Take 100 mg by mouth daily. 07/27/19   [provider]  mesalamine (APRISO) 0.375 g 24 hr capsule Take 0.75 g by mouth daily. Take 2 capsules (0.75 mg) 07/21/19   [provider]      Allergies    Iohexol    Review of Systems   Review of Systems  Respiratory:  Negative  for cough.   Cardiovascular:  Negative for chest pain.  Gastrointestinal:  Positive for abdominal pain and nausea. Negative for constipation, diarrhea, melena and vomiting.  Genitourinary:  Negative for dysuria.    Physical Exam Updated Vital Signs BP (!) 150/95 (BP Location: Right Arm)   Pulse 72   Temp 97.7 F (36.5 C) (Oral)   Resp 18   Ht 5\' 9"  (1.753 m)   Wt 81.6 kg   SpO2 97%   BMI 26.58 kg/m  Physical Exam Vitals and nursing note reviewed.  Constitutional:      General: He is not in acute distress.    Appearance: Normal appearance. He is well-developed. He is not diaphoretic.  HENT:     Head: Normocephalic and atraumatic.     Nose: Nose normal.  Eyes:     Conjunctiva/sclera: Conjunctivae normal.     Pupils: Pupils are equal, round, and reactive to light.  Cardiovascular:     Rate and Rhythm: Normal rate and regular rhythm.     Pulses: Normal pulses.     Heart sounds: Normal heart sounds.  Pulmonary:     Effort: Pulmonary effort is normal.     Breath sounds: Normal breath sounds. No wheezing or rales.  Abdominal:     General: Bowel sounds are normal.     Palpations: Abdomen is soft. There is no mass.  Tenderness: There is abdominal tenderness. There is no guarding or rebound.     Hernia: No hernia is present.  Musculoskeletal:        General: Normal range of motion.     Cervical back: Normal range of motion and neck supple.  Skin:    General: Skin is warm and dry.     Capillary Refill: Capillary refill takes less than 2 seconds.  Neurological:     General: No focal deficit present.     Mental Status: He is alert and oriented to person, place, and time.  Psychiatric:        Mood and Affect: Mood normal.        Behavior: Behavior normal.    ED Results / Procedures / Treatments   Labs (all labs ordered are listed, but only abnormal results are displayed) Results for orders placed or performed during the hospital encounter of 02/18/23  CBC with  Differential  Result Value Ref Range   WBC 12.7 (H) 4.0 - 10.5 K/uL   RBC 4.87 4.22 - 5.81 MIL/uL   Hemoglobin 14.8 13.0 - 17.0 g/dL   HCT 40.9 81.1 - 91.4 %   MCV 91.0 80.0 - 100.0 fL   MCH 30.4 26.0 - 34.0 pg   MCHC 33.4 30.0 - 36.0 g/dL   RDW 78.2 95.6 - 21.3 %   Platelets 255 150 - 400 K/uL   nRBC 0.0 0.0 - 0.2 %   Neutrophils Relative % 84 %   Neutro Abs 10.6 (H) 1.7 - 7.7 K/uL   Lymphocytes Relative 11 %   Lymphs Abs 1.3 0.7 - 4.0 K/uL   Monocytes Relative 4 %   Monocytes Absolute 0.6 0.1 - 1.0 K/uL   Eosinophils Relative 1 %   Eosinophils Absolute 0.1 0.0 - 0.5 K/uL   Basophils Relative 0 %   Basophils Absolute 0.0 0.0 - 0.1 K/uL   Immature Granulocytes 0 %   Abs Immature Granulocytes 0.04 0.00 - 0.07 K/uL  Comprehensive metabolic panel  Result Value Ref Range   Sodium 137 135 - 145 mmol/L   Potassium 4.0 3.5 - 5.1 mmol/L   Chloride 107 98 - 111 mmol/L   CO2 22 22 - 32 mmol/L   Glucose, Bld 124 (H) 70 - 99 mg/dL   BUN 22 (H) 6 - 20 mg/dL   Creatinine, Ser 0.86 0.61 - 1.24 mg/dL   Calcium 9.1 8.9 - 57.8 mg/dL   Total Protein 6.6 6.5 - 8.1 g/dL   Albumin 3.8 3.5 - 5.0 g/dL   AST 20 15 - 41 U/L   ALT 21 0 - 44 U/L   Alkaline Phosphatase 57 38 - 126 U/L   Total Bilirubin 0.5 0.3 - 1.2 mg/dL   GFR, Estimated >46 >96 mL/min   Anion gap 8 5 - 15   CT Renal Stone Study  Result Date: 02/18/2023 CLINICAL DATA:  Flank pain EXAM: CT ABDOMEN AND PELVIS WITHOUT CONTRAST TECHNIQUE: Multidetector CT imaging of the abdomen and pelvis was performed following the standard protocol without IV contrast. RADIATION DOSE REDUCTION: This exam was performed according to the departmental dose-optimization program which includes automated exposure control, adjustment of the mA and/or kV according to patient size and/or use of iterative reconstruction technique. COMPARISON:  09/15/2019 FINDINGS: Lower chest: No acute abnormality. Hepatobiliary: No focal liver abnormality is seen. No gallstones,  gallbladder wall thickening, or biliary dilatation. Pancreas: Unremarkable. No pancreatic ductal dilatation or surrounding inflammatory changes. Spleen: Normal in size without focal abnormality.  Adrenals/Urinary Tract: Adrenal glands are within normal limits. Right kidney shows no renal calculi left kidney shows mild fullness of the collecting system although no ureteral stone is seen. The bladder is partially distended. Stomach/Bowel: Scattered fecal material is noted throughout the colon. No obstructive or inflammatory changes are noted. The appendix is within normal limits. Distal small bowel demonstrates some mural fatty change and mild inflammatory change similar to that seen on the prior examination. The proximal small bowel is mildly dilated consistent with a partial small bowel obstruction. The transition point occurs just proximal to the inflamed terminal ileum. These changes are similar to that seen on the prior exam. The proximal small bowel and stomach however appear within normal limits. Vascular/Lymphatic: Aortic atherosclerosis. No enlarged abdominal or pelvic lymph nodes. Reproductive: Prostate is unremarkable. Other: Small fat containing inguinal hernias noted bilaterally. These are stable from the prior exam. Fluid is noted within. Musculoskeletal: No acute or significant osseous findings. IMPRESSION: Inflammatory changes in the terminal ileum similar to that seen on the prior exam with partial small bowel obstruction just proximal to these changes. The overall appearance is similar to that noted on the prior exam. No renal calculi or obstructive changes are noted. No other focal abnormality is noted. Electronically Signed   By: Alcide Clever M.D.   On: 02/18/2023 01:17     Radiology CT Renal Stone Study  Result Date: 02/18/2023 CLINICAL DATA:  Flank pain EXAM: CT ABDOMEN AND PELVIS WITHOUT CONTRAST TECHNIQUE: Multidetector CT imaging of the abdomen and pelvis was performed following the  standard protocol without IV contrast. RADIATION DOSE REDUCTION: This exam was performed according to the departmental dose-optimization program which includes automated exposure control, adjustment of the mA and/or kV according to patient size and/or use of iterative reconstruction technique. COMPARISON:  09/15/2019 FINDINGS: Lower chest: No acute abnormality. Hepatobiliary: No focal liver abnormality is seen. No gallstones, gallbladder wall thickening, or biliary dilatation. Pancreas: Unremarkable. No pancreatic ductal dilatation or surrounding inflammatory changes. Spleen: Normal in size without focal abnormality. Adrenals/Urinary Tract: Adrenal glands are within normal limits. Right kidney shows no renal calculi left kidney shows mild fullness of the collecting system although no ureteral stone is seen. The bladder is partially distended. Stomach/Bowel: Scattered fecal material is noted throughout the colon. No obstructive or inflammatory changes are noted. The appendix is within normal limits. Distal small bowel demonstrates some mural fatty change and mild inflammatory change similar to that seen on the prior examination. The proximal small bowel is mildly dilated consistent with a partial small bowel obstruction. The transition point occurs just proximal to the inflamed terminal ileum. These changes are similar to that seen on the prior exam. The proximal small bowel and stomach however appear within normal limits. Vascular/Lymphatic: Aortic atherosclerosis. No enlarged abdominal or pelvic lymph nodes. Reproductive: Prostate is unremarkable. Other: Small fat containing inguinal hernias noted bilaterally. These are stable from the prior exam. Fluid is noted within. Musculoskeletal: No acute or significant osseous findings. IMPRESSION: Inflammatory changes in the terminal ileum similar to that seen on the prior exam with partial small bowel obstruction just proximal to these changes. The overall appearance is  similar to that noted on the prior exam. No renal calculi or obstructive changes are noted. No other focal abnormality is noted. Electronically Signed   By: Alcide Clever M.D.   On: 02/18/2023 01:17    Procedures Procedures    Medications Ordered in ED Medications  0.9 %  sodium chloride infusion ( Intravenous New Bag/Given  02/18/23 0152)  ondansetron (ZOFRAN) injection 4 mg (4 mg Intravenous Given 02/18/23 0145)  methylPREDNISolone sodium succinate (SOLU-MEDROL) 125 mg/2 mL injection 125 mg (125 mg Intravenous Given 02/18/23 0147)  fentaNYL (SUBLIMAZE) injection 50 mcg (50 mcg Intravenous Given 02/18/23 0149)    ED Course/ Medical Decision Making/ A&P                             Medical Decision Making Patient ate blueberries today and then developed RLQ pain and nausea, with Crohn's disease   Problems Addressed: Crohn's colitis, unspecified complication (HCC):    Details: Steroids admit and consult to GI SBO (small bowel obstruction) [facility]):    Details: IVF bowel rest and admission and consult to surgery   Amount and/or Complexity of Data Reviewed Independent Historian: spouse    Details: See above  External Data Reviewed: radiology and notes.    Details: Previous admission for Crohn's reviewed  Labs: ordered.    Details: White count elevated 12.7, hemoglobin 14.8, normal platelet count.  Normal sodium 137, normal potassium 4, normal creatinine 1.22, normal LFTs  Radiology: ordered and independent interpretation performed.    Details: No free air by me on CT Discussion of management or test interpretation with external provider(s): Consulted [facility] by secure chat Consulted [facility] general surgery by chat  Consulted Dr. Arlean Hopping of [facility] for admission   Risk Prescription drug management. Parenteral controlled substances. Decision regarding hospitalization.    Final Clinical Impression(s) / ED Diagnoses Final diagnoses:  SBO (small bowel obstruction) (HCC)  Crohn's colitis,  unspecified complication (HCC)   The patient appears reasonably stabilized for admission considering the current resources, flow, and capabilities available in the ED at this time, and I doubt any other [facility] requiring further screening and/or treatment in the ED prior to admission.  Rx / DC Orders ED Discharge Orders     None         Nazir Hacker, MD 02/18/23 0225

## 2023-02-18 NOTE — Progress Notes (Signed)
Subjective: Patient admitted this morning, see detailed H&P by Dr Dalene Carrow 57 year old male with history of colon disease, hypertension who was admitted to [facility] on 02/18/2023 with acute Crohn's flare complicated by small bowel obstruction. Patient developed 2 days of sharp right-sided abdominal discomfort, with decreased flatus.  CT abdomen/pelvis showed inflammatory into the terminal ileum with partial small bowel obstruction just proximal to the changes without any evidence of abscess or perforation.  General surgery and gastroenterology were consulted.  Patient started on steroids  Vitals:   02/18/23 0315 02/18/23 0934  BP: (!) 144/87 136/78  Pulse: (!) 56 (!) 58  Resp: 18 14  Temp: 98 F (36.7 C) (!) 97.5 F (36.4 C)  SpO2: 95% 96%      A/P Acute Crohn's flare Partial small bowel obstruction  Continue Solu-Medrol 60 mg IV daily General surgery has seen, no plan for surgical intervention Will switch to p.o. prednisone from tomorrow morning as per GI recommendation Noted on clear liquid diet    Meredeth Ide [facility]

## 2023-02-18 NOTE — Consult Note (Signed)
[facility] Consultation Note  Referring Provider: [facility] Primary Care Physician:  Gweneth Dimitri, MD Primary Gastroenterologist:  Dr. Bosie Clos  Reason for Consultation:  Small bowel obstruction  HPI: Vernon Rollins. is a 57 y.o. male admitted progressive lower abdominal pain, nausea, abdominal distention.  CT showed active inflammation with partial small bowel obstruction in terminal ileum.  He has no nausea or vomiting; abdominal distention improving; has passed flatus but no stools.  Overall feels better.   Past Medical History:  Diagnosis Date   Crohn disease (HCC)    HTN (hypertension)     Past Surgical History:  Procedure Laterality Date   SHOULDER SURGERY     TONSILLECTOMY      Prior to Admission medications   Medication Sig Start Date End Date Taking? Authorizing Provider  albuterol (PROVENTIL HFA;VENTOLIN HFA) 108 (90 BASE) MCG/ACT inhaler Inhale 2 puffs into the lungs every 6 (six) hours as needed for wheezing or shortness of breath.    [provider]  escitalopram (LEXAPRO) 20 MG tablet Take 20 mg by mouth every morning.    [provider]  losartan (COZAAR) 100 MG tablet Take 100 mg by mouth daily. 07/27/19   [provider]  mesalamine (APRISO) 0.375 g 24 hr capsule Take 0.75 g by mouth daily. Take 2 capsules (0.75 mg) 07/21/19   [provider]    Current Facility-Administered Medications  Medication Dose Route Frequency Provider Last Rate Last Admin   0.9 %  sodium chloride infusion   Intravenous Continuous Palumbo, April, MD 125 mL/hr at 02/18/23 0857 New Bag at 02/18/23 0857   acetaminophen (TYLENOL) tablet 650 mg  650 mg Oral Q6H PRN Howerter, Justin B, DO       Or   acetaminophen (TYLENOL) suppository 650 mg  650 mg Rectal Q6H PRN Howerter, Justin B, DO       HYDROmorphone (DILAUDID) injection 0.5 mg  0.5 mg Intravenous Q2H PRN Howerter, Justin B, DO       methylPREDNISolone sodium succinate  (SOLU-MEDROL) 125 mg/2 mL injection 60 mg  60 mg Intravenous Daily Howerter, Justin B, DO   60 mg at 02/18/23 0856   naloxone (NARCAN) injection 0.4 mg  0.4 mg Intravenous PRN Howerter, Justin B, DO       ondansetron (ZOFRAN) injection 4 mg  4 mg Intravenous Q6H PRN Howerter, Justin B, DO        Allergies as of 02/18/2023 - Review Complete 02/18/2023  Allergen Reaction Noted   Iohexol Swelling 09/15/2019    Family History  Problem Relation Age of Onset   Crohn's disease Neg Hx     Social History   Socioeconomic History   Marital status: Married    Spouse name: Not on file   Number of children: Not on file   Years of education: Not on file   Highest education level: Not on file  Occupational History   Not on file  Tobacco Use   Smoking status: Never   Smokeless tobacco: Never  Substance and Sexual Activity   Alcohol use: No   Drug use: No   Sexual activity: Yes  Other Topics Concern   Not on file  Social History Narrative   Not on file   Social Determinants of Health   Financial Resource Strain: Not on file  Food Insecurity: No Food Insecurity (02/18/2023)   Hunger Vital Sign    Worried About Running Out of Food in the Last Year: Never true    Ran Out  of Food in the Last Year: Never true  Transportation Needs: No Transportation Needs (02/18/2023)   PRAPARE - Administrator, Civil Service (Medical): No    Lack of Transportation (Non-Medical): No  Physical Activity: Not on file  Stress: Not on file  Social Connections: Not on file  Intimate Partner Violence: Not At Risk (02/18/2023)   Humiliation, Afraid, Rape, and Kick questionnaire    Fear of Current or Ex-Partner: No    Emotionally Abused: No    Physically Abused: No    Sexually Abused: No    Review of Systems: As per HPI, all others negative  Physical Exam: Vital signs in last 24 hours: Temp:  [97.5 F (36.4 C)-98 F (36.7 C)] 97.8 F (36.6 C) (07/01 1341) Pulse Rate:  [56-72] 61 (07/01  1341) Resp:  [14-18] 16 (07/01 1341) BP: (136-150)/(78-95) 148/81 (07/01 1341) SpO2:  [95 %-98 %] 98 % (07/01 1341) Weight:  [81.6 kg] 81.6 kg (07/01 0314) Last BM Date : 02/16/23 General:   Alert,  Well-developed, well-nourished, pleasant and cooperative in NAD Head:  Normocephalic and atraumatic. Eyes:  Sclera clear, no icterus.   Conjunctiva pink. Ears:  Normal auditory acuity. Nose:  No deformity, discharge,  or lesions. Mouth:  No deformity or lesions.  Oropharynx pink & moist. Neck:  Supple; no masses or thyromegaly. Lungs:  No respiratory distress. Abdomen:  Soft, nontender and nondistended. No masses, hepatosplenomegaly or hernias noted. Normal bowel sounds, without guarding, and without rebound.     Msk:  Symmetrical without gross deformities. Normal posture. Pulses:  Normal pulses noted. Extremities:  Without clubbing or edema. Neurologic:  Alert and  oriented x4;  grossly normal neurologically. Skin:  Intact without significant lesions or rashes. Psych:  Alert and cooperative. Normal mood and affect.   Lab Results: Recent Labs    02/18/23 0103 02/18/23 0409  WBC 12.7* 14.7*  HGB 14.8 14.8  HCT 44.3 43.5  PLT 255 250   BMET Recent Labs    02/18/23 0103 02/18/23 0409  NA 137 141  K 4.0 4.4  CL 107 104  CO2 22 23  GLUCOSE 124* 151*  BUN 22* 24*  CREATININE 1.22 1.21  CALCIUM 9.1 9.5   LFT Recent Labs    02/18/23 0409  PROT 6.6  ALBUMIN 3.8  AST 18  ALT 21  ALKPHOS 60  BILITOT 0.7   PT/INR No results for input(s): "LABPROT", "INR" in the last 72 hours.  Studies/Results: CT Renal Stone Study  Result Date: 02/18/2023 CLINICAL DATA:  Flank pain EXAM: CT ABDOMEN AND PELVIS WITHOUT CONTRAST TECHNIQUE: Multidetector CT imaging of the abdomen and pelvis was performed following the standard protocol without IV contrast. RADIATION DOSE REDUCTION: This exam was performed according to the departmental dose-optimization program which includes automated  exposure control, adjustment of the mA and/or kV according to patient size and/or use of iterative reconstruction technique. COMPARISON:  09/15/2019 FINDINGS: Lower chest: No acute abnormality. Hepatobiliary: No focal liver abnormality is seen. No gallstones, gallbladder wall thickening, or biliary dilatation. Pancreas: Unremarkable. No pancreatic ductal dilatation or surrounding inflammatory changes. Spleen: Normal in size without focal abnormality. Adrenals/Urinary Tract: Adrenal glands are within normal limits. Right kidney shows no renal calculi left kidney shows mild fullness of the collecting system although no ureteral stone is seen. The bladder is partially distended. Stomach/Bowel: Scattered fecal material is noted throughout the colon. No obstructive or inflammatory changes are noted. The appendix is within normal limits. Distal small bowel demonstrates some mural  fatty change and mild inflammatory change similar to that seen on the prior examination. The proximal small bowel is mildly dilated consistent with a partial small bowel obstruction. The transition point occurs just proximal to the inflamed terminal ileum. These changes are similar to that seen on the prior exam. The proximal small bowel and stomach however appear within normal limits. Vascular/Lymphatic: Aortic atherosclerosis. No enlarged abdominal or pelvic lymph nodes. Reproductive: Prostate is unremarkable. Other: Small fat containing inguinal hernias noted bilaterally. These are stable from the prior exam. Fluid is noted within. Musculoskeletal: No acute or significant osseous findings. IMPRESSION: Inflammatory changes in the terminal ileum similar to that seen on the prior exam with partial small bowel obstruction just proximal to these changes. The overall appearance is similar to that noted on the prior exam. No renal calculi or obstructive changes are noted. No other focal abnormality is noted. Electronically Signed   By: Alcide Clever  M.D.   On: 02/18/2023 01:17    Impression:   Crohn's disease. Partial small bowel obstruction, seemingly from acute flare of Crohn's disease.  Plan:   Patient improving on IV steroids. Start clear liquid diet. Consider transition to oral steroids in 24-36 hours. Patient will ultimately need consideration of possible biologic therapy for his Crohn's disease; he can see and discuss this with Dr. Bosie Clos, his gastroenterologist, as an outpatient. [facility] will follow.   LOS: 0 days   Denise Washburn M  02/18/2023, 2:07 PM  Cell [phone] If no answer or after 5 PM call [phone]

## 2023-02-18 NOTE — H&P (Signed)
History and Physical      Vernon Rollins. WUJ:[id number] DOB: 03/06/1966 DOA: 02/18/2023; DOS: 02/18/2023  PCP: Gweneth Dimitri, MD  Patient coming from: home   I have personally briefly reviewed patient's old medical records in [facility]  Chief Complaint: abdominal pain  HPI: Vernon Rollins. is a 57 y.o. male with medical history significant for Crohn's disease, essential hypertension, who is admitted to [facility] on 02/18/2023 with acute Crohn's flare complicated by small bowel obstruction after presenting from home to [facility] ED complaining of abdominal pain.   The patient knowledges a history of Crohn's disease, and presents this evening complaining of 2 days of progressive sharp, nonradiating right-sided abdominal discomfort, worse in the right lower quadrant.  Intensifies with palpation.  Over the last days also noted diminished flatus production, with most recent bowel movement now occurring 2 days ago.  He also notes associated intermittent nausea in the absence of any vomiting.  Denies any associated subjective fever, chills, rigors, or generalized myalgias.  No recent dysuria or gross hematuria.  No recent trauma.  He reports that the above constellation of symptoms are very similar to that which he experienced at the time of his most recent acute Crohn's flare, which occurred in 2021.  He notes that he follows with Dr. Bosie Clos [facility] gastroenterology for his Crohn's and that his Crohn's has been well-controlled on mesalamine as an outpatient.  He notes good compliance with this latter medication.  He notes that his previous acute Crohn's flare resolved with conservative measures including steroids, prn IV antiemetics, prn IV pain medication, and IV fluids.     ED Course:  Vital signs in the ED were notable for the following: Afebrile; heart rate in the 70s; systolic blood pressures in the 140s to 150s; respiratory rate 18; oxygen saturation 97% on room  air.  Labs were notable for the following: CMP notable for the following: Sodium 137, bicarbonate 22, creatinine 1.22, liver enzymes within normal limits.  CBC notable for white blood cell count 12,700.  Urinalysis has been ordered, with result currently pending.  Per my interpretation, EKG in ED demonstrated the following: None  Imaging in the ED, per corresponding formal radiology read, was notable for the following: CT abdomen/pelvis shows inflammatory changes in the terminal ileum, with partial small bowel obstruction just proximal to these changes, in the absence of any evidence of abscess or bowel perforation.  EDP contacted on-call [facility] gastroenterology, Dr. Dulce Sellar, Requesting formal consultation in the morning.  Additionally, EDP has contacted on-call general surgery, requesting consultation in the context of associated small bowel obstruction.  While in the ED, the following were administered: Fentanyl 50 mcg IV x 1, Solu-Medrol 125 mg IV x 1, Zofran 4 mg IV x 1, normal saline continuous at 125 cc/h.  Subsequently, the patient was admitted for further evaluation management of presenting acute Crohn's flare complicated by small bowel obstruction.     Review of Systems: As per HPI otherwise 10 point review of systems negative.   Past Medical History:  Diagnosis Date   Crohn disease (HCC)    HTN (hypertension)     Past Surgical History:  Procedure Laterality Date   SHOULDER SURGERY     TONSILLECTOMY      Social History:  reports that he has never smoked. He has never used smokeless tobacco. He reports that he does not drink alcohol and does not use drugs.   Allergies  Allergen Reactions   Iohexol Swelling  Right orbital swelling post injection of IV contrast for CT scan of abd/pel.  Patient will need pre-med    Family History  Problem Relation Age of Onset   Crohn's disease Neg Hx     Family history reviewed and not pertinent    Prior to Admission medications    Medication Sig Start Date End Date Taking? Authorizing Provider  albuterol (PROVENTIL HFA;VENTOLIN HFA) 108 (90 BASE) MCG/ACT inhaler Inhale 2 puffs into the lungs every 6 (six) hours as needed for wheezing or shortness of breath.    [provider]  escitalopram (LEXAPRO) 20 MG tablet Take 20 mg by mouth every morning.    [provider]  losartan (COZAAR) 100 MG tablet Take 100 mg by mouth daily. 07/27/19   [provider]  mesalamine (APRISO) 0.375 g 24 hr capsule Take 0.75 g by mouth daily. Take 2 capsules (0.75 mg) 07/21/19   [provider]     Objective    Physical Exam: Vitals:   02/18/23 0044 02/18/23 0045  BP: (!) 150/95   Pulse: 72   Resp: 18   Temp: 97.7 F (36.5 C)   TempSrc: Oral   SpO2: 97%   Weight: 81.6 kg 81.6 kg  Height: 5\' 8"  (1.727 m) 5\' 9"  (1.753 m)    General: appears to be stated age; alert, oriented Skin: warm, dry, no rash Head:  AT/[place] Mouth:  Oral mucosa membranes appear dry, normal dentition Neck: supple; trachea midline Heart:  RRR; did not appreciate any M/R/G Lungs: CTAB, did not appreciate any wheezes, rales, or rhonchi Abdomen: Hypoactive bowel sounds noted; soft, ND, mild tenderness to palpation, most prominent over the right lower quadrant, in the absence of any associated guarding, rigidity, or rebound tenderness Vascular: 2+ pedal pulses b/l; 2+ radial pulses b/l Extremities: no peripheral edema, no muscle wasting Neuro: strength and sensation intact in upper and lower extremities b/l     Labs on Admission: I have personally reviewed following labs and imaging studies  CBC: Recent Labs  Lab 02/18/23 0103  WBC 12.7*  NEUTROABS 10.6*  HGB 14.8  HCT 44.3  MCV 91.0  PLT 255   Basic Metabolic Panel: Recent Labs  Lab 02/18/23 0103  NA 137  K 4.0  CL 107  CO2 22  GLUCOSE 124*  BUN 22*  CREATININE 1.22  CALCIUM 9.1   GFR: Estimated Creatinine Clearance: 66.8 mL/min (by C-G formula  based on SCr of 1.22 mg/dL). Liver Function Tests: Recent Labs  Lab 02/18/23 0103  AST 20  ALT 21  ALKPHOS 57  BILITOT 0.5  PROT 6.6  ALBUMIN 3.8   No results for input(s): "LIPASE", "AMYLASE" in the last 168 hours. No results for input(s): "AMMONIA" in the last 168 hours. Coagulation Profile: No results for input(s): "INR", "PROTIME" in the last 168 hours. Cardiac Enzymes: No results for input(s): "CKTOTAL", "CKMB", "CKMBINDEX", "TROPONINI" in the last 168 hours. BNP (last 3 results) No results for input(s): "PROBNP" in the last 8760 hours. HbA1C: No results for input(s): "HGBA1C" in the last 72 hours. CBG: No results for input(s): "GLUCAP" in the last 168 hours. Lipid Profile: No results for input(s): "CHOL", "HDL", "LDLCALC", "TRIG", "CHOLHDL", "LDLDIRECT" in the last 72 hours. Thyroid Function Tests: No results for input(s): "TSH", "T4TOTAL", "FREET4", "T3FREE", "THYROIDAB" in the last 72 hours. Anemia Panel: No results for input(s): "VITAMINB12", "FOLATE", "FERRITIN", "TIBC", "IRON", "RETICCTPCT" in the last 72 hours. Urine analysis:    Component Value Date/Time   COLORURINE YELLOW 09/16/2019  0744   APPEARANCEUR CLEAR 09/16/2019 0744   LABSPEC 1.026 09/16/2019 0744   PHURINE 5.0 09/16/2019 0744   GLUCOSEU NEGATIVE 09/16/2019 0744   HGBUR NEGATIVE 09/16/2019 0744   BILIRUBINUR NEGATIVE 09/16/2019 0744   KETONESUR 5 (A) 09/16/2019 0744   PROTEINUR NEGATIVE 09/16/2019 0744   UROBILINOGEN 1.0 11/14/2013 1752   NITRITE NEGATIVE 09/16/2019 0744   LEUKOCYTESUR NEGATIVE 09/16/2019 0744    Radiological Exams on Admission: CT Renal Stone Study  Result Date: 02/18/2023 CLINICAL DATA:  Flank pain EXAM: CT ABDOMEN AND PELVIS WITHOUT CONTRAST TECHNIQUE: Multidetector CT imaging of the abdomen and pelvis was performed following the Rollins protocol without IV contrast. RADIATION DOSE REDUCTION: This exam was performed according to the departmental dose-optimization program  which includes automated exposure control, adjustment of the mA and/or kV according to patient size and/or use of iterative reconstruction technique. COMPARISON:  09/15/2019 FINDINGS: Lower chest: No acute abnormality. Hepatobiliary: No focal liver abnormality is seen. No gallstones, gallbladder wall thickening, or biliary dilatation. Pancreas: Unremarkable. No pancreatic ductal dilatation or surrounding inflammatory changes. Spleen: Normal in size without focal abnormality. Adrenals/Urinary Tract: Adrenal glands are within normal limits. Right kidney shows no renal calculi left kidney shows mild fullness of the collecting system although no ureteral stone is seen. The bladder is partially distended. Stomach/Bowel: Scattered fecal material is noted throughout the colon. No obstructive or inflammatory changes are noted. The appendix is within normal limits. Distal small bowel demonstrates some mural fatty change and mild inflammatory change similar to that seen on the prior examination. The proximal small bowel is mildly dilated consistent with a partial small bowel obstruction. The transition point occurs just proximal to the inflamed terminal ileum. These changes are similar to that seen on the prior exam. The proximal small bowel and stomach however appear within normal limits. Vascular/Lymphatic: Aortic atherosclerosis. No enlarged abdominal or pelvic lymph nodes. Reproductive: Prostate is unremarkable. Other: Small fat containing inguinal hernias noted bilaterally. These are stable from the prior exam. Fluid is noted within. Musculoskeletal: No acute or significant osseous findings. IMPRESSION: Inflammatory changes in the terminal ileum similar to that seen on the prior exam with partial small bowel obstruction just proximal to these changes. The overall appearance is similar to that noted on the prior exam. No renal calculi or obstructive changes are noted. No other focal abnormality is noted. Electronically  Signed   By: Alcide Clever M.D.   On: 02/18/2023 01:17      Assessment/Plan    Principal Problem:   Crohn's colitis (HCC) Active Problems:   SBO (small bowel obstruction) (HCC)   Essential hypertension   Leukocytosis   Abdominal pain   Depression     #) Acute Crohn's flare: In the context of a documented history of Crohn's disease, the patient presents with 2 days of progressive right-sided abdominal discomfort, with presenting CT abdomen/pelvis showing inflammatory changes in the terminal ileum, consistent with the distribution of the patient's discomfort.  No evidence of acute peritoneal signs on exam.  The patient reports good compliance with outpatient mesalamine.  Presenting acute Crohn's flare is complicated by small bowel obstruction, but in the absence of any evidence of bowel perforation or abscess.  EDP contacted on-call [facility] gastroenterology, Dr. Dulce Sellar, Requesting formal consultation in the morning.    Plan: Solumedrol 60 mg IV qdaily. Prn IV Dilaudid. Prn IV Zofran. Continuous IVF's. CMP, CBC, and serum magnesium level in the AM. NPO.  [facility] to formally consult, as above.  Further evaluation management of  small bowel obstruction, as further detailed below.                 #) Small bowel obstruction: Appears to be as a consequence of acute Crohn's flare, with evidence of partial small bowel obstruction noted to be just proximal to the inflammatory changes in the terminal ileum consistent with the patient's acute Crohn's flare.  No evidence of bowel perforation.  Will proceed with conservative measures, including further evaluation management of acute Crohn's flare as the suspected driving factor behind this partial small bowel obstruction.  Of note, EDP has contacted on-call general surgery for their input in the context of small bowel obstruction.  Plan: N.p.o.  IV fluids, as above.  As needed IV Dilaudid.  As needed IV Zofran.  Further  evaluation management of acute Crohn's flare, as above.  CMP, CBC in the morning.                   #) Leukocytosis: Presenting CBC reflects mildly elevated white cell count of 12,700. Suspect that this is inflammatory/reactive in nature in the context of presenting acute Crohn's flare with resultant small bowel obstruction.   No evidence to suggest underlying infectious process at this time, but will follow for results of urinalysis that are currently pending.  No additional SIRS criteria met at this time. Overall, criteria for sepsis are not currently met. Appears hemodynamically stable.  Therefore, will refrain from initiation of antibiotics at this time.  Plan: Repeat CBC with diff in the morning.  Monitor strict I's and O's, daily weights.  IV fluids, as above.  Further evaluation management of presenting acute Crohn's flare, as above.  Follow-up result of UA.                   #) Essential Hypertension: documented h/o such, with outpatient antihypertensive regimen including losartan.  SBP's in the ED today: 140s 150s mmHg.   Plan: Close monitoring of subsequent BP via routine VS. in the setting of current n.p.o. status, will hold him losartan for now.                   #) Depression: documented h/o such. On Lexapro as outpatient.    Plan: In the setting of current n.p.o. status, will hold home Lexapro for now.     DVT prophylaxis: SCD's   Code Status: Full code Family Communication: none Disposition Plan: Per Rounding Team Consults called: EDP contacted Dr. Dulce Sellar of [facility] as well as sent message to on-call gen surg, as further detailed above;  Admission status: Inpatient    I SPENT GREATER THAN 75  MINUTES IN CLINICAL CARE TIME/MEDICAL DECISION-MAKING IN COMPLETING THIS ADMISSION.     Chaney Born Retia Cordle DO [facility] From 7PM - 7AM   02/18/2023, 2:25 AM

## 2023-02-18 NOTE — Consult Note (Signed)
Consult Note  Vernon Rollins. Mar 02, 1966  [id number].    Requesting MD: Dr. Sharl Ma Chief Complaint/Reason for Consult: Chron's flare with SBO  HPI: Vernon Rollins. Is a 57 y.o. male with medical history significant for Crohn's disease on Mesalamine and HTN who presented to [facility] ED with abdominal pain.  Pain began yesterday, 6/30, around none. Pain is constant, located on the right side of his abdomen, and has gradually been worsening. Associated nausea without vomiting. Denies fevers. No flatus or BM since onset. Last bowel movement was yesterday morning and normal. At baseline he has 1-4 bowel movement every day.  No melena or hematochezia.  He thinks symptoms are similar to previous Crohn's flares which is why he presented today. No fever, chills, urinary or respiratory complaints.  Reports he was dx with Crohn's 20 years ago. He follows with Dr. Bosie Clos of [facility]. Reports last colonoscopy was last year and reassuring (no polyp's, no bx taken).   In the ED her was afebrile without tachycardia or hypotension. WBC 12.7. CT scan showing inflammatory changes in the terminal ileum with partial small bowel obstruction just proximal.  He has been admitted to the hospitalist service.  GI has been consulted.  General surgery asked to see in regard to Crohn's flare and partial small bowel obstruction.  He reports he has been seen by GI already and was told he would be started on steroids + CLD.   Alcohol use: denies Tobacco use: denies Substance use: denies Blood thinners: none Past abdominal Surgeries: None  ROS: Reviewed and as above, see hpi  Family History  Problem Relation Age of Onset   Crohn's disease Neg Hx     Past Medical History:  Diagnosis Date   Crohn disease (HCC)    HTN (hypertension)     Past Surgical History:  Procedure Laterality Date   SHOULDER SURGERY     TONSILLECTOMY      Social History:  reports that he has never smoked. He has never used  smokeless tobacco. He reports that he does not drink alcohol and does not use drugs.  Allergies:  Allergies  Allergen Reactions   Iohexol Swelling    Right orbital swelling post injection of IV contrast for CT scan of abd/pel.  Patient will need pre-med    Medications Prior to Admission  Medication Sig Dispense Refill   albuterol (PROVENTIL HFA;VENTOLIN HFA) 108 (90 BASE) MCG/ACT inhaler Inhale 2 puffs into the lungs every 6 (six) hours as needed for wheezing or shortness of breath.     escitalopram (LEXAPRO) 20 MG tablet Take 20 mg by mouth every morning.     losartan (COZAAR) 100 MG tablet Take 100 mg by mouth daily.     mesalamine (APRISO) 0.375 g 24 hr capsule Take 0.75 g by mouth daily. Take 2 capsules (0.75 mg)      Blood pressure (!) 144/87, pulse (!) 56, temperature 98 F (36.7 C), temperature source Oral, resp. rate 18, height 5\' 9"  (1.753 m), weight 81.6 kg, SpO2 95 %. Physical Exam: General: pleasant, WD/WN male who is laying in bed in NAD HEENT: head is normocephalic, atraumatic Heart: regular, rate, and rhythm Lungs: CTAB, no wheezes, rhonchi, or rales noted.  Respiratory effort nonlabored Abd: Soft, mild distension, very mild right sided abdominal ttp. +BS. Inguinal hernias are soft, NT, without overlying skin changes. No masses, other hernias, or organomegaly MS: no BUE or BLE edema Skin: warm and dry  Psych: A&Ox4 with an  appropriate affect Neuro: cranial nerves grossly intact, normal speech, thought process intact, moves all extremities, gait not assessed  Results for orders placed or performed during the hospital encounter of 02/18/23 (from the past 48 hour(s))  CBC with Differential     Status: Abnormal   Collection Time: 02/18/23  1:03 AM  Result Value Ref Range   WBC 12.7 (H) 4.0 - 10.5 K/uL   RBC 4.87 4.22 - 5.81 MIL/uL   Hemoglobin 14.8 13.0 - 17.0 g/dL   HCT 86.5 78.4 - 69.6 %   MCV 91.0 80.0 - 100.0 fL   MCH 30.4 26.0 - 34.0 pg   MCHC 33.4 30.0 - 36.0  g/dL   RDW 29.5 28.4 - 13.2 %   Platelets 255 150 - 400 K/uL   nRBC 0.0 0.0 - 0.2 %   Neutrophils Relative % 84 %   Neutro Abs 10.6 (H) 1.7 - 7.7 K/uL   Lymphocytes Relative 11 %   Lymphs Abs 1.3 0.7 - 4.0 K/uL   Monocytes Relative 4 %   Monocytes Absolute 0.6 0.1 - 1.0 K/uL   Eosinophils Relative 1 %   Eosinophils Absolute 0.1 0.0 - 0.5 K/uL   Basophils Relative 0 %   Basophils Absolute 0.0 0.0 - 0.1 K/uL   Immature Granulocytes 0 %   Abs Immature Granulocytes 0.04 0.00 - 0.07 K/uL    Comment: Performed at [facility], [address], [place], [place] [address]  Comprehensive metabolic panel     Status: Abnormal   Collection Time: 02/18/23  1:03 AM  Result Value Ref Range   Sodium 137 135 - 145 mmol/L   Potassium 4.0 3.5 - 5.1 mmol/L   Chloride 107 98 - 111 mmol/L   CO2 22 22 - 32 mmol/L   Glucose, Bld 124 (H) 70 - 99 mg/dL    Comment: Glucose reference range applies only to samples taken after fasting for at least 8 hours.   BUN 22 (H) 6 - 20 mg/dL   Creatinine, Ser 2.72 0.61 - 1.24 mg/dL   Calcium 9.1 8.9 - 53.6 mg/dL   Total Protein 6.6 6.5 - 8.1 g/dL   Albumin 3.8 3.5 - 5.0 g/dL   AST 20 15 - 41 U/L   ALT 21 0 - 44 U/L   Alkaline Phosphatase 57 38 - 126 U/L   Total Bilirubin 0.5 0.3 - 1.2 mg/dL   GFR, Estimated >64 >40 mL/min    Comment: (NOTE) Calculated using the CKD-EPI Creatinine Equation (2021)    Anion gap 8 5 - 15    Comment: Performed at [facility], [address], [place], [place] [address]  Magnesium     Status: None   Collection Time: 02/18/23  1:03 AM  Result Value Ref Range   Magnesium 2.0 1.7 - 2.4 mg/dL    Comment: Performed at [facility], [address] [place], [place] [address]  Urinalysis, Routine w reflex microscopic -Urine, Clean Catch     Status: None   Collection Time: 02/18/23  3:19 AM  Result Value Ref Range   Color, Urine YELLOW YELLOW   APPearance CLEAR CLEAR   Specific  Gravity, Urine 1.029 1.005 - 1.030   pH 5.0 5.0 - 8.0   Glucose, UA NEGATIVE NEGATIVE mg/dL   Hgb urine dipstick NEGATIVE NEGATIVE   Bilirubin Urine NEGATIVE NEGATIVE   Ketones, ur NEGATIVE NEGATIVE mg/dL   Protein, ur NEGATIVE NEGATIVE mg/dL   Nitrite NEGATIVE NEGATIVE  Leukocytes,Ua NEGATIVE NEGATIVE    Comment: Performed at [facility], [address] [place], [place] [address]  CBC with Differential/Platelet     Status: Abnormal   Collection Time: 02/18/23  4:09 AM  Result Value Ref Range   WBC 14.7 (H) 4.0 - 10.5 K/uL   RBC 4.79 4.22 - 5.81 MIL/uL   Hemoglobin 14.8 13.0 - 17.0 g/dL   HCT 60.4 54.0 - 98.1 %   MCV 90.8 80.0 - 100.0 fL   MCH 30.9 26.0 - 34.0 pg   MCHC 34.0 30.0 - 36.0 g/dL   RDW 19.1 47.8 - 29.5 %   Platelets 250 150 - 400 K/uL   nRBC 0.0 0.0 - 0.2 %   Neutrophils Relative % 93 %   Neutro Abs 13.6 (H) 1.7 - 7.7 K/uL   Lymphocytes Relative 5 %   Lymphs Abs 0.8 0.7 - 4.0 K/uL   Monocytes Relative 2 %   Monocytes Absolute 0.2 0.1 - 1.0 K/uL   Eosinophils Relative 0 %   Eosinophils Absolute 0.0 0.0 - 0.5 K/uL   Basophils Relative 0 %   Basophils Absolute 0.0 0.0 - 0.1 K/uL   Immature Granulocytes 0 %   Abs Immature Granulocytes 0.05 0.00 - 0.07 K/uL    Comment: Performed at [facility], [address], [place], [place] [address]  Comprehensive metabolic panel     Status: Abnormal   Collection Time: 02/18/23  4:09 AM  Result Value Ref Range   Sodium 141 135 - 145 mmol/L   Potassium 4.4 3.5 - 5.1 mmol/L   Chloride 104 98 - 111 mmol/L   CO2 23 22 - 32 mmol/L   Glucose, Bld 151 (H) 70 - 99 mg/dL    Comment: Glucose reference range applies only to samples taken after fasting for at least 8 hours.   BUN 24 (H) 6 - 20 mg/dL   Creatinine, Ser 8.65 0.61 - 1.24 mg/dL   Calcium 9.5 8.9 - 78.4 mg/dL   Total Protein 6.6 6.5 - 8.1 g/dL   Albumin 3.8 3.5 - 5.0 g/dL   AST 18 15 - 41 U/L   ALT 21 0 - 44 U/L   Alkaline  Phosphatase 60 38 - 126 U/L   Total Bilirubin 0.7 0.3 - 1.2 mg/dL   GFR, Estimated >69 >62 mL/min    Comment: (NOTE) Calculated using the CKD-EPI Creatinine Equation (2021)    Anion gap 14 5 - 15    Comment: Performed at [facility], [address], [place], [place] [address]  Magnesium     Status: None   Collection Time: 02/18/23  4:09 AM  Result Value Ref Range   Magnesium 2.1 1.7 - 2.4 mg/dL    Comment: Performed at [facility], [address] [place], [place] [address]   CT Renal Stone Study  Result Date: 02/18/2023 CLINICAL DATA:  Flank pain EXAM: CT ABDOMEN AND PELVIS WITHOUT CONTRAST TECHNIQUE: Multidetector CT imaging of the abdomen and pelvis was performed following the standard protocol without IV contrast. RADIATION DOSE REDUCTION: This exam was performed according to the departmental dose-optimization program which includes automated exposure control, adjustment of the mA and/or kV according to patient size and/or use of iterative reconstruction technique. COMPARISON:  09/15/2019 FINDINGS: Lower chest: No acute abnormality. Hepatobiliary: No focal liver abnormality is seen. No gallstones, gallbladder wall thickening, or biliary dilatation. Pancreas: Unremarkable. No pancreatic ductal dilatation or surrounding inflammatory changes. Spleen: Normal in size without  focal abnormality. Adrenals/Urinary Tract: Adrenal glands are within normal limits. Right kidney shows no renal calculi left kidney shows mild fullness of the collecting system although no ureteral stone is seen. The bladder is partially distended. Stomach/Bowel: Scattered fecal material is noted throughout the colon. No obstructive or inflammatory changes are noted. The appendix is within normal limits. Distal small bowel demonstrates some mural fatty change and mild inflammatory change similar to that seen on the prior examination. The proximal small bowel is mildly dilated consistent with  a partial small bowel obstruction. The transition point occurs just proximal to the inflamed terminal ileum. These changes are similar to that seen on the prior exam. The proximal small bowel and stomach however appear within normal limits. Vascular/Lymphatic: Aortic atherosclerosis. No enlarged abdominal or pelvic lymph nodes. Reproductive: Prostate is unremarkable. Other: Small fat containing inguinal hernias noted bilaterally. These are stable from the prior exam. Fluid is noted within. Musculoskeletal: No acute or significant osseous findings. IMPRESSION: Inflammatory changes in the terminal ileum similar to that seen on the prior exam with partial small bowel obstruction just proximal to these changes. The overall appearance is similar to that noted on the prior exam. No renal calculi or obstructive changes are noted. No other focal abnormality is noted. Electronically Signed   By: Alcide Clever M.D.   On: 02/18/2023 01:17    Anti-infectives (From admission, onward)    None        Assessment/Plan Acute Crohn's flare PSBO  Patient seen and examined and relevant labs and imaging personally reviewed.  Workup and exam consistent with acute Crohn's flare causing a partial small bowel obstruction. No indication for emergency surgery. Patient reports GI has already been by, started steroids and placed him on CLD. In the past he has improved with conservative management. Hopefully that will be the case again this time. Surgical intervention during an acute flare of Crohn's would likely result in bowel resection and ostomy creation. Given that patient is already having diet advanced we will see as needed moving forward. If he was to worsen would recommend NGT placement and please call us back. Will let [facility] know.   FEN: Per GI ID: none VTE: okay for chemical prophylaxis from surgical standpoint  I reviewed nursing notes, ED provider notes, last 24 h vitals and pain scores, last 48 h intake and output,  last 24 h labs and trends, and last 24 h imaging results.  Leary Roca, [facility] 02/18/2023, 7:26 AM Please see Amion for pager number during day hours 7:00am-4:30pm

## 2023-02-18 NOTE — ED Notes (Signed)
ED TO INPATIENT HANDOFF REPORT  ED Nurse Name and Phone #: J.Tayvion Lauder  S Name/Age/Gender Vernon Rollins. 57 y.o. male Room/Bed: WA17/WA17  Code Status   Code Status: Full Code  Home/SNF/Other Home Patient oriented to: self, place, time, and situation Is this baseline? Yes   Triage Complete: Triage complete  Chief Complaint Crohn's colitis (HCC) [K50.10]  Triage Note Presents for flare of Crohn's starting yesterday. Endorses R sided abd pain. Denies blood in stool or fever.     Allergies Allergies  Allergen Reactions   Iohexol Swelling    Right orbital swelling post injection of IV contrast for CT scan of abd/pel.  Patient will need pre-med    Level of Care/Admitting Diagnosis ED Disposition     ED Disposition  Admit   Condition  --   Comment  Hospital [facility]: [facility] [[id number]]  Level of Care: Med-Surg [16]  May admit patient to [facility] or [facility] if equivalent level of care is available:: No  Covid Evaluation: Asymptomatic - no recent exposure (last 10 days) testing not required  Diagnosis: Crohn's colitis [facility]) [409811]  Admitting Physician: Angie Fava [[id number]]  Attending Physician: Angie Fava [phone]  Certification:: I certify this patient will need inpatient services for at least 2 midnights  Estimated Length of Stay: 2          B Medical/Surgery History Past Medical History:  Diagnosis Date   Crohn disease (HCC)    HTN (hypertension)    Past Surgical History:  Procedure Laterality Date   SHOULDER SURGERY     TONSILLECTOMY       A IV Location/Drains/Wounds Patient Lines/Drains/Airways Status     Active Line/Drains/Airways     Name Placement date Placement time Site Days   Peripheral IV 02/18/23 20 G Right Antecubital 02/18/23  0105  Antecubital  less than 1            Intake/Output Last 24 hours No intake or output data in the 24 hours ending 02/18/23  0257  Labs/Imaging Results for orders placed or performed during the hospital encounter of 02/18/23 (from the past 48 hour(s))  CBC with Differential     Status: Abnormal   Collection Time: 02/18/23  1:03 AM  Result Value Ref Range   WBC 12.7 (H) 4.0 - 10.5 K/uL   RBC 4.87 4.22 - 5.81 MIL/uL   Hemoglobin 14.8 13.0 - 17.0 g/dL   HCT 65.7 84.6 - 96.2 %   MCV 91.0 80.0 - 100.0 fL   MCH 30.4 26.0 - 34.0 pg   MCHC 33.4 30.0 - 36.0 g/dL   RDW 95.2 84.1 - 32.4 %   Platelets 255 150 - 400 K/uL   nRBC 0.0 0.0 - 0.2 %   Neutrophils Relative % 84 %   Neutro Abs 10.6 (H) 1.7 - 7.7 K/uL   Lymphocytes Relative 11 %   Lymphs Abs 1.3 0.7 - 4.0 K/uL   Monocytes Relative 4 %   Monocytes Absolute 0.6 0.1 - 1.0 K/uL   Eosinophils Relative 1 %   Eosinophils Absolute 0.1 0.0 - 0.5 K/uL   Basophils Relative 0 %   Basophils Absolute 0.0 0.0 - 0.1 K/uL   Immature Granulocytes 0 %   Abs Immature Granulocytes 0.04 0.00 - 0.07 K/uL    Comment: Performed at [facility], [address] [place], [place] [address]  Comprehensive metabolic panel     Status: Abnormal   Collection Time: 02/18/23  1:03  AM  Result Value Ref Range   Sodium 137 135 - 145 mmol/L   Potassium 4.0 3.5 - 5.1 mmol/L   Chloride 107 98 - 111 mmol/L   CO2 22 22 - 32 mmol/L   Glucose, Bld 124 (H) 70 - 99 mg/dL    Comment: Glucose reference range applies only to samples taken after fasting for at least 8 hours.   BUN 22 (H) 6 - 20 mg/dL   Creatinine, Ser 1.61 0.61 - 1.24 mg/dL   Calcium 9.1 8.9 - 09.6 mg/dL   Total Protein 6.6 6.5 - 8.1 g/dL   Albumin 3.8 3.5 - 5.0 g/dL   AST 20 15 - 41 U/L   ALT 21 0 - 44 U/L   Alkaline Phosphatase 57 38 - 126 U/L   Total Bilirubin 0.5 0.3 - 1.2 mg/dL   GFR, Estimated >04 >54 mL/min    Comment: (NOTE) Calculated using the CKD-EPI Creatinine Equation (2021)    Anion gap 8 5 - 15    Comment: Performed at [facility], [address], [place], [place]  [address]   CT Renal Stone Study  Result Date: 02/18/2023 CLINICAL DATA:  Flank pain EXAM: CT ABDOMEN AND PELVIS WITHOUT CONTRAST TECHNIQUE: Multidetector CT imaging of the abdomen and pelvis was performed following the standard protocol without IV contrast. RADIATION DOSE REDUCTION: This exam was performed according to the departmental dose-optimization program which includes automated exposure control, adjustment of the mA and/or kV according to patient size and/or use of iterative reconstruction technique. COMPARISON:  09/15/2019 FINDINGS: Lower chest: No acute abnormality. Hepatobiliary: No focal liver abnormality is seen. No gallstones, gallbladder wall thickening, or biliary dilatation. Pancreas: Unremarkable. No pancreatic ductal dilatation or surrounding inflammatory changes. Spleen: Normal in size without focal abnormality. Adrenals/Urinary Tract: Adrenal glands are within normal limits. Right kidney shows no renal calculi left kidney shows mild fullness of the collecting system although no ureteral stone is seen. The bladder is partially distended. Stomach/Bowel: Scattered fecal material is noted throughout the colon. No obstructive or inflammatory changes are noted. The appendix is within normal limits. Distal small bowel demonstrates some mural fatty change and mild inflammatory change similar to that seen on the prior examination. The proximal small bowel is mildly dilated consistent with a partial small bowel obstruction. The transition point occurs just proximal to the inflamed terminal ileum. These changes are similar to that seen on the prior exam. The proximal small bowel and stomach however appear within normal limits. Vascular/Lymphatic: Aortic atherosclerosis. No enlarged abdominal or pelvic lymph nodes. Reproductive: Prostate is unremarkable. Other: Small fat containing inguinal hernias noted bilaterally. These are stable from the prior exam. Fluid is noted within. Musculoskeletal: No acute or  significant osseous findings. IMPRESSION: Inflammatory changes in the terminal ileum similar to that seen on the prior exam with partial small bowel obstruction just proximal to these changes. The overall appearance is similar to that noted on the prior exam. No renal calculi or obstructive changes are noted. No other focal abnormality is noted. Electronically Signed   By: Alcide Clever M.D.   On: 02/18/2023 01:17    Pending Labs Unresulted Labs (From admission, onward)     Start     Ordered   02/18/23 0500  CBC with Differential/Platelet  Tomorrow morning,   R        02/18/23 0224   02/18/23 0500  Comprehensive metabolic panel  Tomorrow morning,   R        02/18/23 0224  02/18/23 0500  Magnesium  Tomorrow morning,   R        02/18/23 0224   02/18/23 0225  Magnesium  Add-on,   AD        02/18/23 0224   02/18/23 0052  Urinalysis, Routine w reflex microscopic -Urine, Clean Catch  Once,   URGENT       Question Answer Comment  Specimen Source Urine, Clean Catch   Release to patient Immediate      02/18/23 0051            Vitals/Pain Today's Vitals   02/18/23 0044 02/18/23 0045  BP: (!) 150/95   Pulse: 72   Resp: 18   Temp: 97.7 F (36.5 C)   TempSrc: Oral   SpO2: 97%   Weight: 81.6 kg 81.6 kg  Height: 5\' 8"  (1.727 m) 5\' 9"  (1.753 m)  PainSc:  7     Isolation Precautions No active isolations  Medications Medications  0.9 %  sodium chloride infusion ( Intravenous New Bag/Given 02/18/23 0152)  acetaminophen (TYLENOL) tablet 650 mg (has no administration in time range)    Or  acetaminophen (TYLENOL) suppository 650 mg (has no administration in time range)  ondansetron (ZOFRAN) injection 4 mg (has no administration in time range)  naloxone (NARCAN) injection 0.4 mg (has no administration in time range)  HYDROmorphone (DILAUDID) injection 0.5 mg (has no administration in time range)  methylPREDNISolone sodium succinate (SOLU-MEDROL) 125 mg/2 mL injection 60 mg (has no  administration in time range)  ondansetron (ZOFRAN) injection 4 mg (4 mg Intravenous Given 02/18/23 0145)  methylPREDNISolone sodium succinate (SOLU-MEDROL) 125 mg/2 mL injection 125 mg (125 mg Intravenous Given 02/18/23 0147)  fentaNYL (SUBLIMAZE) injection 50 mcg (50 mcg Intravenous Given 02/18/23 0149)    Mobility walks     Focused Assessments   R Recommendations: See Admitting Provider Note  Report given to: Vernie Shanks

## 2023-02-18 NOTE — ED Triage Notes (Signed)
Presents for flare of Crohn's starting yesterday. Endorses R sided abd pain. Denies blood in stool or fever.

## 2023-02-19 LAB — COMPREHENSIVE METABOLIC PANEL WITH GFR
ALT: 15 U/L (ref 0–44)
AST: 13 U/L — ABNORMAL LOW (ref 15–41)
Albumin: 2.9 g/dL — ABNORMAL LOW (ref 3.5–5.0)
Alkaline Phosphatase: 51 U/L (ref 38–126)
Anion gap: 6 (ref 5–15)
BUN: 17 mg/dL (ref 6–20)
CO2: 22 mmol/L (ref 22–32)
Calcium: 7.8 mg/dL — ABNORMAL LOW (ref 8.9–10.3)
Chloride: 109 mmol/L (ref 98–111)
Creatinine, Ser: 1.11 mg/dL (ref 0.61–1.24)
GFR, Estimated: >60 mL/min (ref >60)
Glucose, Bld: 105 mg/dL — ABNORMAL HIGH (ref 70–99)
Potassium: 3.8 mmol/L (ref 3.5–5.1)
Sodium: 137 mmol/L (ref 135–145)
Total Bilirubin: 0.6 mg/dL (ref 0.3–1.2)
Total Protein: 5.4 g/dL — ABNORMAL LOW (ref 6.5–8.1)

## 2023-02-19 LAB — CBC
HCT: 38.8 % — ABNORMAL LOW (ref 39.0–52.0)
Hemoglobin: 12.9 g/dL — ABNORMAL LOW (ref 13.0–17.0)
MCH: 30.6 pg (ref 26.0–34.0)
MCHC: 33.2 g/dL (ref 30.0–36.0)
MCV: 91.9 fL (ref 80.0–100.0)
Platelets: 243 K/uL (ref 150–400)
RBC: 4.22 MIL/uL (ref 4.22–5.81)
RDW: 12.8 % (ref 11.5–15.5)
WBC: 11.8 K/uL — ABNORMAL HIGH (ref 4.0–10.5)
nRBC: 0 % (ref 0.0–0.2)

## 2023-02-19 MED ORDER — PREDNISONE 20 MG PO TABS
40.0000 mg | ORAL_TABLET | Freq: Every day | ORAL | Status: DC
  Administered 2023-02-20: 40 mg via ORAL
  Filled 2023-02-19: qty 2

## 2023-02-19 MED ORDER — POTASSIUM CHLORIDE CRYS ER 20 MEQ PO TBCR: 20.0000 meq | EXTENDED_RELEASE_TABLET | Freq: Once | ORAL | Status: DC

## 2023-02-19 NOTE — Progress Notes (Signed)
Subjective: No abdominal pain. Tolerating clear liquids. Passing flatus. No vomiting. No bowel movements. No blood in stool.  Objective: Vital signs in last 24 hours: Temp:  [97.6 F (36.4 C)-98.2 F (36.8 C)] 97.7 F (36.5 C) (07/02 0524) Pulse Rate:  [57-71] 57 (07/02 0524) Resp:  [16-18] 18 (07/02 0524) BP: (129-148)/(72-81) 129/72 (07/02 0524) SpO2:  [95 %-98 %] 95 % (07/02 0524) Weight:  [81.9 kg] 81.9 kg (07/02 0500) Weight change: 0.253 kg Last BM Date : 02/16/23  PE: GEN:  NAD ABD:  Soft, very mild distention, non-tender  Lab Results: CBC    Component Value Date/Time   WBC 11.8 (H) 02/19/2023 0417   RBC 4.22 02/19/2023 0417   HGB 12.9 (L) 02/19/2023 0417   HCT 38.8 (L) 02/19/2023 0417   PLT 243 02/19/2023 0417   MCV 91.9 02/19/2023 0417   MCH 30.6 02/19/2023 0417   MCHC 33.2 02/19/2023 0417   RDW 12.8 02/19/2023 0417   LYMPHSABS 0.8 02/18/2023 0409   MONOABS 0.2 02/18/2023 0409   EOSABS 0.0 02/18/2023 0409   BASOSABS 0.0 02/18/2023 0409  CMP     Component Value Date/Time   NA 137 02/19/2023 0417   K 3.8 02/19/2023 0417   CL 109 02/19/2023 0417   CO2 22 02/19/2023 0417   GLUCOSE 105 (H) 02/19/2023 0417   BUN 17 02/19/2023 0417   CREATININE 1.11 02/19/2023 0417   CALCIUM 7.8 (L) 02/19/2023 0417   PROT 5.4 (L) 02/19/2023 0417   ALBUMIN 2.9 (L) 02/19/2023 0417   AST 13 (L) 02/19/2023 0417   ALT 15 02/19/2023 0417   ALKPHOS 51 02/19/2023 0417   BILITOT 0.6 02/19/2023 0417   GFRNONAA >60 02/19/2023 0417   Assessment:   Crohn's disease. Partial small bowel obstruction, seemingly from acute flare of Crohn's disease.  Plan:   Transition to oral steroids. Advance diet. If continues to improve, anticipate discharge tomorrow from GI perspective. [facility] will follow.   Freddy Jaksch 02/19/2023, 10:41 AM   Cell [phone] If no answer or after 5 PM call [phone]

## 2023-02-19 NOTE — Progress Notes (Signed)
[facility]  PROGRESS NOTE  Vernon Rollins. WUJ:[id number] DOB: 03/01/1966 DOA: 02/18/2023 PCP: Gweneth Dimitri, MD   Brief HPI:   57 year old male with history of colon disease, hypertension who was admitted to [facility] long hospital on 02/18/2023 with acute Crohn's flare complicated by small bowel obstruction. Patient developed 2 days of sharp right-sided abdominal discomfort, with decreased flatus.  CT abdomen/pelvis showed inflammatory into the terminal ileum with partial small bowel obstruction just proximal to the changes without any evidence of abscess or perforation.  General surgery and gastroenterology were consulted.  Patient started on steroids    Assessment/Plan:    Acute Crohn's flare -Improving with steroids, IV Solu-Medrol has been transitioned to p.o. prednisone 40 mg daily -GI following  Partial small bowel obstruction -Improving, likely in setting of above -Patient stated that this is recurrent of partial SBO with Crohn's flare which is third time in the past 20 years -Started on dysphagia 3 diet -Tolerating diet well  If he continues to improve, likely discharge home in a.m.    Medications     [START ON 02/20/2023] predniSONE  40 mg Oral Q breakfast     Data Reviewed:   CBG:  No results for input(s): "GLUCAP" in the last 168 hours.  SpO2: 98 %    Vitals:   02/19/23 0123 02/19/23 0500 02/19/23 0524 02/19/23 1402  BP: 138/77  129/72 (!) 150/74  Pulse: 62  (!) 57 61  Resp: 16  18 18   Temp: 97.6 F (36.4 C)  97.7 F (36.5 C) 98 F (36.7 C)  TempSrc: Oral  Oral Oral  SpO2: 97%  95% 98%  Weight:  81.9 kg    Height:          Data Reviewed:  Basic Metabolic Panel: Recent Labs  Lab 02/18/23 0103 02/18/23 0409 02/19/23 0417  NA 137 141 137  K 4.0 4.4 3.8  CL 107 104 109  CO2 22 23 22   GLUCOSE 124* 151* 105*  BUN 22* 24* 17  CREATININE 1.22 1.21 1.11  CALCIUM 9.1 9.5 7.8*  MG 2.0 2.1  --     CBC: Recent Labs  Lab 02/18/23 0103  02/18/23 0409 02/19/23 0417  WBC 12.7* 14.7* 11.8*  NEUTROABS 10.6* 13.6*  --   HGB 14.8 14.8 12.9*  HCT 44.3 43.5 38.8*  MCV 91.0 90.8 91.9  PLT 255 250 243    LFT Recent Labs  Lab 02/18/23 0103 02/18/23 0409 02/19/23 0417  AST 20 18 13*  ALT 21 21 15   ALKPHOS 57 60 51  BILITOT 0.5 0.7 0.6  PROT 6.6 6.6 5.4*  ALBUMIN 3.8 3.8 2.9*     Antibiotics: Anti-infectives (From admission, onward)    None        DVT prophylaxis: SCDs  Code Status: Full code  Family Communication: Discussed with patient's wife at bedside   CONSULTS    Subjective   Patient seen and examined, denies abdominal pain.   Objective    Physical Examination:   General-appears in no acute distress Heart-S1-S2, regular, no murmur auscultated Lungs-clear to auscultation bilaterally, no wheezing or crackles auscultated Abdomen-soft, nontender, no organomegaly Extremities-no edema in the lower extremities Neuro-alert, oriented x3, no focal deficit noted  Status is: Inpatient:             Meredeth Ide   [facility] If 7PM-7AM, please contact night-coverage at [email or web address], Office  [phone]   02/19/2023, 2:17 PM  LOS: 1 day

## 2023-02-19 NOTE — TOC CM/SW Note (Signed)
Transition of Care [facility]) - Inpatient Brief Assessment   Patient Details  Name: Vernon Rollins. MRN: [id number] Date of Birth: 12-29-1965  Transition of Care (TOC) CM/SW Contact:    Armanda Heritage, RN Phone Number: 02/19/2023, 11:37 AM   Clinical Narrative: Patient from home and anticipated to discharge home with no TOC needs identified.    Transition of Care Asessment: Insurance and Status: Insurance coverage has been reviewed Patient has primary care physician: Yes Home environment has been reviewed: home Prior level of function:: independent Prior/Current Home Services: No current home services Social Determinants of Health Reivew: SDOH reviewed no interventions necessary Readmission risk has been reviewed: Yes Transition of care needs: no transition of care needs at this time

## 2023-02-20 DIAGNOSIS — N179 Acute kidney failure, unspecified: Secondary | ICD-10-CM

## 2023-02-20 LAB — BASIC METABOLIC PANEL
Anion gap: 8 (ref 5–15)
BUN: 17 mg/dL (ref 6–20)
CO2: 26 mmol/L (ref 22–32)
Calcium: 8.2 mg/dL — ABNORMAL LOW (ref 8.9–10.3)
Chloride: 106 mmol/L (ref 98–111)
Creatinine, Ser: 1.29 mg/dL — ABNORMAL HIGH (ref 0.61–1.24)
GFR, Estimated: >60 mL/min (ref >60)
Glucose, Bld: 94 mg/dL (ref 70–99)
Potassium: 3.8 mmol/L (ref 3.5–5.1)
Sodium: 140 mmol/L (ref 135–145)

## 2023-02-20 MED ORDER — PREDNISONE 20 MG PO TABS: 40.0000 mg | ORAL_TABLET | Freq: Every day | ORAL | 1 refills | Status: AC

## 2023-02-20 NOTE — Discharge Summary (Signed)
Physician Discharge Summary   Patient: Vernon Rollins. MRN: [id number] DOB: November 25, 1965  Admit date:     02/18/2023  Discharge date: 02/20/23  Discharge Physician: Meredeth Ide   PCP: Gweneth Dimitri, MD   Recommendations at discharge:   Follow-up gastroenterology as outpatient Follow-up BMP in 1 week at PCP office  Discharge Diagnoses: Principal Problem:   Crohn's colitis, unspecified complication (HCC) Active Problems:   SBO (small bowel obstruction) (HCC)   Essential hypertension   Leukocytosis   Abdominal pain   Depression  Resolved Problems:   * No resolved hospital problems. *  Hospital Course: 57 year old male with history of colon disease, hypertension who was admitted to [facility] on 02/18/2023 with acute Crohn's flare complicated by small bowel obstruction. Patient developed 2 days of sharp right-sided abdominal discomfort, with decreased flatus.  CT abdomen/pelvis showed inflammatory into the terminal ileum with partial small bowel obstruction just proximal to the changes without any evidence of abscess or perforation.  General surgery and gastroenterology were consulted.  Patient started on steroids  Assessment and Plan:  Acute Crohn's flare -Resolved after starting IV Solu-Medrol which was transitioned to p.o. prednisone -Gastroenterology has cleared for discharge -Follow-up gastroenterology clinic, Dr. Lavonia Drafts as outpatient  Partial small bowel obstruction -In setting of Crohn's exacerbation -Resolved  Mild AKI -Creatinine 1.28 today -Despite getting IV fluids, blood pressure has been stable -Will need repeat BMP in 1 week at PCP office -Patient and his wife has been told about this         Consultants: Gastroenterology Procedures performed:   Disposition: Home Diet recommendation:  Discharge Diet Orders (From admission, onward)     Start     Ordered   02/20/23 0000  Diet - low sodium heart healthy        02/20/23 0925            Regular diet DISCHARGE MEDICATION: Allergies as of 02/20/2023       Reactions   Iodinated Contrast Media Swelling, Other (See Comments)   CT contrast agents- Iohexol is one of a group of iodinated organic compounds  "Right orbital swelling post injection of IV contrast for CT scan of abd/pel.  Patient will need pre-med."   Iohexol Swelling, Other (See Comments)   Right orbital swelling post injection of IV contrast for CT scan of abd/pel.  Patient will need pre-med.        Medication List     TAKE these medications    albuterol 108 (90 Base) MCG/ACT inhaler Commonly known as: VENTOLIN HFA Inhale 2 puffs into the lungs every 6 (six) hours as needed for wheezing or shortness of breath.   DULoxetine 60 MG capsule Commonly known as: CYMBALTA Take 60 mg by mouth daily.   losartan 100 MG tablet Commonly known as: COZAAR Take 100 mg by mouth daily.   mesalamine 0.375 g 24 hr capsule Commonly known as: APRISO Take 1.5 g by mouth daily.   predniSONE 20 MG tablet Commonly known as: DELTASONE Take 2 tablets (40 mg total) by mouth daily with breakfast. Start taking on: February 21, 2023   tamsulosin 0.4 MG Caps capsule Commonly known as: FLOMAX Take 0.4 mg by mouth at bedtime.        Follow-up Information     Charlott Rakes, MD. Schedule an appointment as soon as possible for a visit.   Specialty: Gastroenterology Contact information: [address] [place] [address] [phone]  Gweneth Dimitri, MD Follow up in 1 week(s).   Specialty: Family Medicine Why: Check BMP in 1 week to check  kidney function Contact information: [address] [place] [address] [phone]                Discharge Exam: Filed Weights   02/18/23 0314 02/19/23 0500 02/20/23 0500  Weight: 81.6 kg 81.9 kg 84.6 kg   General-appears in no acute distress Heart-S1-S2, regular, no murmur auscultated Lungs-clear to auscultation bilaterally, no  wheezing or crackles auscultated Abdomen-soft, nontender, no organomegaly Extremities-no edema in the lower extremities Neuro-alert, oriented x3, no focal deficit noted  Condition at discharge: good  The results of significant diagnostics from this hospitalization (including imaging, microbiology, ancillary and laboratory) are listed below for reference.   Imaging Studies: CT Renal Stone Study  Result Date: 02/18/2023 CLINICAL DATA:  Flank pain EXAM: CT ABDOMEN AND PELVIS WITHOUT CONTRAST TECHNIQUE: Multidetector CT imaging of the abdomen and pelvis was performed following the standard protocol without IV contrast. RADIATION DOSE REDUCTION: This exam was performed according to the departmental dose-optimization program which includes automated exposure control, adjustment of the mA and/or kV according to patient size and/or use of iterative reconstruction technique. COMPARISON:  09/15/2019 FINDINGS: Lower chest: No acute abnormality. Hepatobiliary: No focal liver abnormality is seen. No gallstones, gallbladder wall thickening, or biliary dilatation. Pancreas: Unremarkable. No pancreatic ductal dilatation or surrounding inflammatory changes. Spleen: Normal in size without focal abnormality. Adrenals/Urinary Tract: Adrenal glands are within normal limits. Right kidney shows no renal calculi left kidney shows mild fullness of the collecting system although no ureteral stone is seen. The bladder is partially distended. Stomach/Bowel: Scattered fecal material is noted throughout the colon. No obstructive or inflammatory changes are noted. The appendix is within normal limits. Distal small bowel demonstrates some mural fatty change and mild inflammatory change similar to that seen on the prior examination. The proximal small bowel is mildly dilated consistent with a partial small bowel obstruction. The transition point occurs just proximal to the inflamed terminal ileum. These changes are similar to that seen  on the prior exam. The proximal small bowel and stomach however appear within normal limits. Vascular/Lymphatic: Aortic atherosclerosis. No enlarged abdominal or pelvic lymph nodes. Reproductive: Prostate is unremarkable. Other: Small fat containing inguinal hernias noted bilaterally. These are stable from the prior exam. Fluid is noted within. Musculoskeletal: No acute or significant osseous findings. IMPRESSION: Inflammatory changes in the terminal ileum similar to that seen on the prior exam with partial small bowel obstruction just proximal to these changes. The overall appearance is similar to that noted on the prior exam. No renal calculi or obstructive changes are noted. No other focal abnormality is noted. Electronically Signed   By: Alcide Clever M.D.   On: 02/18/2023 01:17    Microbiology: Results for orders placed or performed during the hospital encounter of 09/15/19  Respiratory Panel by RT PCR (Flu A&B, Covid) - Nasopharyngeal Swab     Status: None   Collection Time: 09/15/19  9:45 PM   Specimen: Nasopharyngeal Swab  Result Value Ref Range Status   SARS Coronavirus 2 by RT PCR NEGATIVE NEGATIVE Final    Comment: (NOTE) SARS-CoV-2 target nucleic acids are NOT DETECTED. The SARS-CoV-2 RNA is generally detectable in upper respiratoy specimens during the acute phase of infection. The lowest concentration of SARS-CoV-2 viral copies this assay can detect is 131 copies/mL. A negative result does not preclude SARS-Cov-2 infection and should not be used as the  sole basis for treatment or other patient management decisions. A negative result may occur with  improper specimen collection/handling, submission of specimen other than nasopharyngeal swab, presence of viral mutation(s) within the areas targeted by this assay, and inadequate number of viral copies (<131 copies/mL). A negative result must be combined with clinical observations, patient history, and epidemiological information.  The expected result is Negative. Fact Sheet for Patients:  https://www.moore.com/ Fact Sheet for Healthcare Providers:  https://www.young.biz/ This test is not yet ap proved or cleared by the [place] FDA and  has been authorized for detection and/or diagnosis of SARS-CoV-2 by FDA under an Emergency Use Authorization (EUA). This EUA will remain  in effect (meaning this test can be used) for the duration of the COVID-19 declaration under Section 564(b)(1) of the Act, 21 U.S.C. section 360bbb-3(b)(1), unless the authorization is terminated or revoked sooner.    Influenza A by PCR NEGATIVE NEGATIVE Final   Influenza B by PCR NEGATIVE NEGATIVE Final    Comment: (NOTE) The Xpert Xpress SARS-CoV-2/FLU/RSV assay is intended as an aid in  the diagnosis of influenza from Nasopharyngeal swab specimens and  should not be used as a sole basis for treatment. Nasal washings and  aspirates are unacceptable for Xpert Xpress SARS-CoV-2/FLU/RSV  testing. Fact Sheet for Patients: https://www.moore.com/ Fact Sheet for Healthcare Providers: https://www.young.biz/ This test is not yet approved or cleared by the [place] FDA and  has been authorized for detection and/or diagnosis of SARS-CoV-2 by  FDA under an Emergency Use Authorization (EUA). This EUA will remain  in effect (meaning this test can be used) for the duration of the  Covid-19 declaration under Section 564(b)(1) of the Act, 21  U.S.C. section 360bbb-3(b)(1), unless the authorization is  terminated or revoked. Performed at [facility], [address], [place], [place] [address]   Urine culture     Status: None   Collection Time: 09/16/19  7:44 AM   Specimen: Urine, Clean Catch  Result Value Ref Range Status   Specimen Description   Final    URINE, CLEAN CATCH Performed at [facility], [address],  [place], [place] [address]    Special Requests   Final    NONE Performed at [facility], [address], [place], [place] [address]    Culture   Final    NO GROWTH Performed at [facility], [address], [place], [place] [address]    Report Status 09/17/2019 FINAL  Final    Labs: CBC: Recent Labs  Lab 02/18/23 0103 02/18/23 0409 02/19/23 0417  WBC 12.7* 14.7* 11.8*  NEUTROABS 10.6* 13.6*  --   HGB 14.8 14.8 12.9*  HCT 44.3 43.5 38.8*  MCV 91.0 90.8 91.9  PLT 255 250 243   Basic Metabolic Panel: Recent Labs  Lab 02/18/23 0103 02/18/23 0409 02/19/23 0417 02/20/23 0450  NA 137 141 137 140  K 4.0 4.4 3.8 3.8  CL 107 104 109 106  CO2 22 23 22 26   GLUCOSE 124* 151* 105* 94  BUN 22* 24* 17 17  CREATININE 1.22 1.21 1.11 1.29*  CALCIUM 9.1 9.5 7.8* 8.2*  MG 2.0 2.1  --   --    Liver Function Tests: Recent Labs  Lab 02/18/23 0103 02/18/23 0409 02/19/23 0417  AST 20 18 13*  ALT 21 21 15   ALKPHOS 57 60 51  BILITOT 0.5 0.7 0.6  PROT 6.6 6.6 5.4*  ALBUMIN 3.8 3.8 2.9*  CBG: No results for input(s): "GLUCAP" in the last 168 hours.  Discharge time spent: greater than 30 minutes.  Signed: Meredeth Ide, MD [facility] 02/20/2023

## 2023-02-20 NOTE — Progress Notes (Signed)
Subjective: No abdominal pain. Tolerating diet. Having bowel movements and flatus.  Objective: Vital signs in last 24 hours: Temp:  [97.6 F (36.4 C)-98 F (36.7 C)] 97.6 F (36.4 C) (07/03 0549) Pulse Rate:  [61] 61 (07/03 0549) Resp:  [18] 18 (07/03 0549) BP: (130-150)/(74-82) 130/79 (07/03 0549) SpO2:  [98 %-100 %] 99 % (07/03 0549) Weight:  [84.6 kg] 84.6 kg (07/03 0500) Weight change: 2.7 kg Last BM Date : 02/19/23  PE: GEN:  NAD ABD:  Soft, non-tender  Lab Results: CBC    Component Value Date/Time   WBC 11.8 (H) 02/19/2023 0417   RBC 4.22 02/19/2023 0417   HGB 12.9 (L) 02/19/2023 0417   HCT 38.8 (L) 02/19/2023 0417   PLT 243 02/19/2023 0417   MCV 91.9 02/19/2023 0417   MCH 30.6 02/19/2023 0417   MCHC 33.2 02/19/2023 0417   RDW 12.8 02/19/2023 0417   LYMPHSABS 0.8 02/18/2023 0409   MONOABS 0.2 02/18/2023 0409   EOSABS 0.0 02/18/2023 0409   BASOSABS 0.0 02/18/2023 0409  CMP     Component Value Date/Time   NA 140 02/20/2023 0450   K 3.8 02/20/2023 0450   CL 106 02/20/2023 0450   CO2 26 02/20/2023 0450   GLUCOSE 94 02/20/2023 0450   BUN 17 02/20/2023 0450   CREATININE 1.29 (H) 02/20/2023 0450   CALCIUM 8.2 (L) 02/20/2023 0450   PROT 5.4 (L) 02/19/2023 0417   ALBUMIN 2.9 (L) 02/19/2023 0417   AST 13 (L) 02/19/2023 0417   ALT 15 02/19/2023 0417   ALKPHOS 51 02/19/2023 0417   BILITOT 0.6 02/19/2023 0417   GFRNONAA >60 02/20/2023 0450   Assessment:  Crohn's disease. Partial small bowel obstruction, seemingly from acute flare of Crohn's disease.  Resolved.  Plan:   Soft, low residue diet as tolerated. Prednisone 40 mg po every day now and upon discharge. We will coordinate outpatient follow-up with Dr. Bosie Clos.  Patient may need change in maintenance therapy for his Crohn's disease. [facility] will sign-off; please call with questions; thank you for the consultation.   Vernon Rollins 02/20/2023, 10:59 AM   Cell [phone] If no answer or  after 5 PM call [phone]

## 2023-02-20 NOTE — Progress Notes (Signed)
Patient and wife were provided with discharge education, both verbalized understanding. IV removed.

## 2023-02-24 DIAGNOSIS — S61246A Puncture wound with foreign body of right little finger without damage to nail, initial encounter: Secondary | ICD-10-CM | POA: Diagnosis not present

## 2023-02-24 DIAGNOSIS — S60456A Superficial foreign body of right little finger, initial encounter: Secondary | ICD-10-CM | POA: Diagnosis not present

## 2023-02-24 DIAGNOSIS — I10 Essential (primary) hypertension: Secondary | ICD-10-CM | POA: Diagnosis not present

## 2023-02-24 DIAGNOSIS — M795 Residual foreign body in soft tissue: Secondary | ICD-10-CM | POA: Diagnosis not present

## 2023-03-05 DIAGNOSIS — Z23 Encounter for immunization: Secondary | ICD-10-CM | POA: Diagnosis not present

## 2023-03-05 DIAGNOSIS — R7989 Other specified abnormal findings of blood chemistry: Secondary | ICD-10-CM | POA: Diagnosis not present

## 2023-04-03 DIAGNOSIS — K5010 Crohn's disease of large intestine without complications: Secondary | ICD-10-CM | POA: Diagnosis not present

## 2023-04-12 DIAGNOSIS — K5010 Crohn's disease of large intestine without complications: Secondary | ICD-10-CM | POA: Diagnosis not present

## 2023-09-12 DIAGNOSIS — Z125 Encounter for screening for malignant neoplasm of prostate: Secondary | ICD-10-CM | POA: Diagnosis not present

## 2023-09-12 DIAGNOSIS — N401 Enlarged prostate with lower urinary tract symptoms: Secondary | ICD-10-CM | POA: Diagnosis not present

## 2023-09-12 DIAGNOSIS — Z23 Encounter for immunization: Secondary | ICD-10-CM | POA: Diagnosis not present

## 2023-09-12 DIAGNOSIS — I10 Essential (primary) hypertension: Secondary | ICD-10-CM | POA: Diagnosis not present

## 2023-09-12 DIAGNOSIS — F411 Generalized anxiety disorder: Secondary | ICD-10-CM | POA: Diagnosis not present

## 2023-09-12 DIAGNOSIS — Z13220 Encounter for screening for lipoid disorders: Secondary | ICD-10-CM | POA: Diagnosis not present

## 2023-09-12 DIAGNOSIS — K5010 Crohn's disease of large intestine without complications: Secondary | ICD-10-CM | POA: Diagnosis not present

## 2023-10-29 DIAGNOSIS — N390 Urinary tract infection, site not specified: Secondary | ICD-10-CM | POA: Diagnosis not present

## 2023-10-29 DIAGNOSIS — B9789 Other viral agents as the cause of diseases classified elsewhere: Secondary | ICD-10-CM | POA: Diagnosis not present

## 2023-10-29 DIAGNOSIS — R300 Dysuria: Secondary | ICD-10-CM | POA: Diagnosis not present

## 2023-11-05 DIAGNOSIS — Z87440 Personal history of urinary (tract) infections: Secondary | ICD-10-CM | POA: Diagnosis not present

## 2023-11-05 DIAGNOSIS — Z6825 Body mass index (BMI) 25.0-25.9, adult: Secondary | ICD-10-CM | POA: Diagnosis not present

## 2023-11-05 DIAGNOSIS — R1031 Right lower quadrant pain: Secondary | ICD-10-CM | POA: Diagnosis not present

## 2023-11-19 DIAGNOSIS — N3001 Acute cystitis with hematuria: Secondary | ICD-10-CM | POA: Diagnosis not present

## 2023-11-19 DIAGNOSIS — R300 Dysuria: Secondary | ICD-10-CM | POA: Diagnosis not present

## 2024-05-29 DIAGNOSIS — K6389 Other specified diseases of intestine: Secondary | ICD-10-CM | POA: Diagnosis not present

## 2024-05-29 DIAGNOSIS — K5000 Crohn's disease of small intestine without complications: Secondary | ICD-10-CM | POA: Diagnosis not present

## 2024-08-17 DIAGNOSIS — L2089 Other atopic dermatitis: Secondary | ICD-10-CM | POA: Diagnosis not present

## 2024-08-17 DIAGNOSIS — D1801 Hemangioma of skin and subcutaneous tissue: Secondary | ICD-10-CM | POA: Diagnosis not present

## 2024-08-17 DIAGNOSIS — D485 Neoplasm of uncertain behavior of skin: Secondary | ICD-10-CM | POA: Diagnosis not present

## 2024-08-17 DIAGNOSIS — L814 Other melanin hyperpigmentation: Secondary | ICD-10-CM | POA: Diagnosis not present

## 2024-08-17 DIAGNOSIS — L821 Other seborrheic keratosis: Secondary | ICD-10-CM | POA: Diagnosis not present

## 2024-08-17 DIAGNOSIS — L570 Actinic keratosis: Secondary | ICD-10-CM | POA: Diagnosis not present
